# Patient Record
Sex: Male | Born: 1945
Health system: Southern US, Community
[De-identification: ages and names within clinical notes are randomized; demographics above are authoritative.]

## PROBLEM LIST (undated history)

## (undated) DIAGNOSIS — R51 Headache: Secondary | ICD-10-CM

## (undated) DIAGNOSIS — M1990 Unspecified osteoarthritis, unspecified site: Secondary | ICD-10-CM

## (undated) DIAGNOSIS — F329 Major depressive disorder, single episode, unspecified: Secondary | ICD-10-CM

## (undated) DIAGNOSIS — M5126 Other intervertebral disc displacement, lumbar region: Secondary | ICD-10-CM

## (undated) DIAGNOSIS — R202 Paresthesia of skin: Secondary | ICD-10-CM

## (undated) DIAGNOSIS — R011 Cardiac murmur, unspecified: Secondary | ICD-10-CM

## (undated) DIAGNOSIS — T753XXA Motion sickness, initial encounter: Secondary | ICD-10-CM

## (undated) DIAGNOSIS — F32A Depression, unspecified: Secondary | ICD-10-CM

## (undated) DIAGNOSIS — I10 Essential (primary) hypertension: Secondary | ICD-10-CM

## (undated) DIAGNOSIS — M8580 Other specified disorders of bone density and structure, unspecified site: Secondary | ICD-10-CM

## (undated) DIAGNOSIS — E785 Hyperlipidemia, unspecified: Secondary | ICD-10-CM

## (undated) DIAGNOSIS — M5136 Other intervertebral disc degeneration, lumbar region: Secondary | ICD-10-CM

## (undated) DIAGNOSIS — F419 Anxiety disorder, unspecified: Secondary | ICD-10-CM

## (undated) DIAGNOSIS — R519 Headache, unspecified: Secondary | ICD-10-CM

## (undated) DIAGNOSIS — E039 Hypothyroidism, unspecified: Secondary | ICD-10-CM

## (undated) DIAGNOSIS — F4010 Social phobia, unspecified: Secondary | ICD-10-CM

## (undated) DIAGNOSIS — E890 Postprocedural hypothyroidism: Secondary | ICD-10-CM

## (undated) DIAGNOSIS — M51369 Other intervertebral disc degeneration, lumbar region without mention of lumbar back pain or lower extremity pain: Secondary | ICD-10-CM

## (undated) HISTORY — DX: Social phobia, unspecified: F40.10

## (undated) HISTORY — DX: Unspecified osteoarthritis, unspecified site: M19.90

## (undated) HISTORY — DX: Other specified disorders of bone density and structure, unspecified site: M85.80

## (undated) HISTORY — DX: Depression, unspecified: F32.A

## (undated) HISTORY — DX: Anxiety disorder, unspecified: F41.9

## (undated) HISTORY — DX: Major depressive disorder, single episode, unspecified: F32.9

---

## 1994-07-20 HISTORY — PX: TOTAL THYROIDECTOMY: SHX2547

## 2006-07-20 HISTORY — PX: MENISCUS REPAIR: SHX5179

## 2007-01-04 ENCOUNTER — Ambulatory Visit: Payer: Self-pay | Admitting: Unknown Physician Specialty

## 2007-01-19 ENCOUNTER — Ambulatory Visit: Payer: Self-pay | Admitting: Orthopaedic Surgery

## 2007-01-25 ENCOUNTER — Ambulatory Visit: Payer: Self-pay | Admitting: Orthopaedic Surgery

## 2011-08-18 ENCOUNTER — Ambulatory Visit: Payer: Self-pay | Admitting: Family Medicine

## 2014-01-16 DIAGNOSIS — G44221 Chronic tension-type headache, intractable: Secondary | ICD-10-CM | POA: Insufficient documentation

## 2014-01-16 DIAGNOSIS — F410 Panic disorder [episodic paroxysmal anxiety] without agoraphobia: Secondary | ICD-10-CM | POA: Insufficient documentation

## 2014-01-17 DIAGNOSIS — F419 Anxiety disorder, unspecified: Secondary | ICD-10-CM | POA: Insufficient documentation

## 2014-01-17 DIAGNOSIS — E785 Hyperlipidemia, unspecified: Secondary | ICD-10-CM | POA: Insufficient documentation

## 2014-01-17 DIAGNOSIS — E890 Postprocedural hypothyroidism: Secondary | ICD-10-CM | POA: Insufficient documentation

## 2014-01-17 DIAGNOSIS — I10 Essential (primary) hypertension: Secondary | ICD-10-CM | POA: Insufficient documentation

## 2014-01-18 DIAGNOSIS — D72819 Decreased white blood cell count, unspecified: Secondary | ICD-10-CM | POA: Insufficient documentation

## 2014-07-25 DIAGNOSIS — M5032 Other cervical disc degeneration, mid-cervical region: Secondary | ICD-10-CM | POA: Diagnosis not present

## 2014-07-25 DIAGNOSIS — E890 Postprocedural hypothyroidism: Secondary | ICD-10-CM | POA: Diagnosis not present

## 2014-07-25 DIAGNOSIS — E780 Pure hypercholesterolemia: Secondary | ICD-10-CM | POA: Diagnosis not present

## 2014-07-25 DIAGNOSIS — M47892 Other spondylosis, cervical region: Secondary | ICD-10-CM | POA: Diagnosis not present

## 2014-07-25 DIAGNOSIS — M542 Cervicalgia: Secondary | ICD-10-CM | POA: Diagnosis not present

## 2014-07-25 DIAGNOSIS — G8929 Other chronic pain: Secondary | ICD-10-CM | POA: Diagnosis not present

## 2014-07-25 DIAGNOSIS — H16132 Photokeratitis, left eye: Secondary | ICD-10-CM | POA: Diagnosis not present

## 2014-08-14 DIAGNOSIS — F339 Major depressive disorder, recurrent, unspecified: Secondary | ICD-10-CM | POA: Diagnosis not present

## 2014-08-14 DIAGNOSIS — F410 Panic disorder [episodic paroxysmal anxiety] without agoraphobia: Secondary | ICD-10-CM | POA: Diagnosis not present

## 2014-08-16 DIAGNOSIS — M5030 Other cervical disc degeneration, unspecified cervical region: Secondary | ICD-10-CM | POA: Insufficient documentation

## 2014-08-16 DIAGNOSIS — M5412 Radiculopathy, cervical region: Secondary | ICD-10-CM | POA: Insufficient documentation

## 2014-08-20 DIAGNOSIS — D485 Neoplasm of uncertain behavior of skin: Secondary | ICD-10-CM | POA: Diagnosis not present

## 2014-08-20 DIAGNOSIS — B079 Viral wart, unspecified: Secondary | ICD-10-CM | POA: Diagnosis not present

## 2014-08-20 DIAGNOSIS — L578 Other skin changes due to chronic exposure to nonionizing radiation: Secondary | ICD-10-CM | POA: Diagnosis not present

## 2014-08-22 DIAGNOSIS — F419 Anxiety disorder, unspecified: Secondary | ICD-10-CM | POA: Diagnosis not present

## 2014-08-28 ENCOUNTER — Ambulatory Visit: Payer: Self-pay | Admitting: Physical Medicine and Rehabilitation

## 2014-08-28 DIAGNOSIS — M47812 Spondylosis without myelopathy or radiculopathy, cervical region: Secondary | ICD-10-CM | POA: Diagnosis not present

## 2014-08-28 DIAGNOSIS — M5022 Other cervical disc displacement, mid-cervical region: Secondary | ICD-10-CM | POA: Diagnosis not present

## 2014-08-28 DIAGNOSIS — M5032 Other cervical disc degeneration, mid-cervical region: Secondary | ICD-10-CM | POA: Diagnosis not present

## 2014-08-28 DIAGNOSIS — M5382 Other specified dorsopathies, cervical region: Secondary | ICD-10-CM | POA: Diagnosis not present

## 2014-08-28 DIAGNOSIS — M5082 Other cervical disc disorders, mid-cervical region: Secondary | ICD-10-CM | POA: Diagnosis not present

## 2014-08-30 DIAGNOSIS — F410 Panic disorder [episodic paroxysmal anxiety] without agoraphobia: Secondary | ICD-10-CM | POA: Diagnosis not present

## 2014-08-30 DIAGNOSIS — F4010 Social phobia, unspecified: Secondary | ICD-10-CM | POA: Diagnosis not present

## 2014-08-30 DIAGNOSIS — F411 Generalized anxiety disorder: Secondary | ICD-10-CM | POA: Diagnosis not present

## 2014-09-06 DIAGNOSIS — M5030 Other cervical disc degeneration, unspecified cervical region: Secondary | ICD-10-CM | POA: Diagnosis not present

## 2014-09-06 DIAGNOSIS — M5412 Radiculopathy, cervical region: Secondary | ICD-10-CM | POA: Diagnosis not present

## 2014-09-25 DIAGNOSIS — F329 Major depressive disorder, single episode, unspecified: Secondary | ICD-10-CM | POA: Diagnosis not present

## 2014-09-25 DIAGNOSIS — F411 Generalized anxiety disorder: Secondary | ICD-10-CM | POA: Diagnosis not present

## 2014-09-25 DIAGNOSIS — F410 Panic disorder [episodic paroxysmal anxiety] without agoraphobia: Secondary | ICD-10-CM | POA: Diagnosis not present

## 2014-10-09 DIAGNOSIS — M5030 Other cervical disc degeneration, unspecified cervical region: Secondary | ICD-10-CM | POA: Diagnosis not present

## 2014-10-09 DIAGNOSIS — M62838 Other muscle spasm: Secondary | ICD-10-CM | POA: Diagnosis not present

## 2014-10-09 DIAGNOSIS — M5412 Radiculopathy, cervical region: Secondary | ICD-10-CM | POA: Diagnosis not present

## 2014-10-26 DIAGNOSIS — M5030 Other cervical disc degeneration, unspecified cervical region: Secondary | ICD-10-CM | POA: Diagnosis not present

## 2014-10-26 DIAGNOSIS — M5412 Radiculopathy, cervical region: Secondary | ICD-10-CM | POA: Diagnosis not present

## 2014-11-07 DIAGNOSIS — F410 Panic disorder [episodic paroxysmal anxiety] without agoraphobia: Secondary | ICD-10-CM | POA: Diagnosis not present

## 2014-11-07 DIAGNOSIS — F329 Major depressive disorder, single episode, unspecified: Secondary | ICD-10-CM | POA: Diagnosis not present

## 2014-11-07 DIAGNOSIS — F411 Generalized anxiety disorder: Secondary | ICD-10-CM | POA: Diagnosis not present

## 2014-11-13 ENCOUNTER — Other Ambulatory Visit: Payer: Self-pay | Admitting: Physical Medicine and Rehabilitation

## 2014-11-13 ENCOUNTER — Other Ambulatory Visit: Payer: Self-pay | Admitting: Gastroenterology

## 2014-11-13 DIAGNOSIS — M5417 Radiculopathy, lumbosacral region: Secondary | ICD-10-CM

## 2014-11-13 DIAGNOSIS — M5412 Radiculopathy, cervical region: Secondary | ICD-10-CM | POA: Diagnosis not present

## 2014-11-13 DIAGNOSIS — M5030 Other cervical disc degeneration, unspecified cervical region: Secondary | ICD-10-CM | POA: Diagnosis not present

## 2014-11-13 DIAGNOSIS — M5116 Intervertebral disc disorders with radiculopathy, lumbar region: Secondary | ICD-10-CM | POA: Insufficient documentation

## 2014-11-13 DIAGNOSIS — M5416 Radiculopathy, lumbar region: Secondary | ICD-10-CM | POA: Diagnosis not present

## 2014-11-21 ENCOUNTER — Ambulatory Visit
Admission: RE | Admit: 2014-11-21 | Discharge: 2014-11-21 | Disposition: A | Discharge status: Home / Self Care | Payer: Medicare Other | Source: Ambulatory Visit | Attending: Physical Medicine and Rehabilitation | Admitting: Physical Medicine and Rehabilitation

## 2014-11-21 DIAGNOSIS — M5126 Other intervertebral disc displacement, lumbar region: Secondary | ICD-10-CM | POA: Diagnosis not present

## 2014-11-21 DIAGNOSIS — M5417 Radiculopathy, lumbosacral region: Secondary | ICD-10-CM

## 2014-11-21 DIAGNOSIS — M545 Low back pain: Secondary | ICD-10-CM | POA: Diagnosis present

## 2014-11-21 DIAGNOSIS — M47816 Spondylosis without myelopathy or radiculopathy, lumbar region: Secondary | ICD-10-CM | POA: Diagnosis not present

## 2014-11-21 DIAGNOSIS — M79604 Pain in right leg: Secondary | ICD-10-CM | POA: Diagnosis present

## 2014-11-21 DIAGNOSIS — M5127 Other intervertebral disc displacement, lumbosacral region: Secondary | ICD-10-CM | POA: Diagnosis not present

## 2014-11-21 DIAGNOSIS — M47817 Spondylosis without myelopathy or radiculopathy, lumbosacral region: Secondary | ICD-10-CM | POA: Diagnosis not present

## 2014-12-12 DIAGNOSIS — Z79899 Other long term (current) drug therapy: Secondary | ICD-10-CM | POA: Diagnosis not present

## 2014-12-12 DIAGNOSIS — F328 Other depressive episodes: Secondary | ICD-10-CM | POA: Diagnosis not present

## 2014-12-12 DIAGNOSIS — F411 Generalized anxiety disorder: Secondary | ICD-10-CM | POA: Diagnosis not present

## 2014-12-12 DIAGNOSIS — F410 Panic disorder [episodic paroxysmal anxiety] without agoraphobia: Secondary | ICD-10-CM | POA: Diagnosis not present

## 2014-12-12 DIAGNOSIS — F4010 Social phobia, unspecified: Secondary | ICD-10-CM | POA: Diagnosis not present

## 2014-12-12 DIAGNOSIS — F329 Major depressive disorder, single episode, unspecified: Secondary | ICD-10-CM | POA: Diagnosis not present

## 2014-12-13 DIAGNOSIS — M5412 Radiculopathy, cervical region: Secondary | ICD-10-CM | POA: Diagnosis not present

## 2014-12-13 DIAGNOSIS — M5126 Other intervertebral disc displacement, lumbar region: Secondary | ICD-10-CM | POA: Diagnosis not present

## 2014-12-13 DIAGNOSIS — M5030 Other cervical disc degeneration, unspecified cervical region: Secondary | ICD-10-CM | POA: Diagnosis not present

## 2014-12-13 DIAGNOSIS — M5416 Radiculopathy, lumbar region: Secondary | ICD-10-CM | POA: Diagnosis not present

## 2014-12-28 DIAGNOSIS — M9902 Segmental and somatic dysfunction of thoracic region: Secondary | ICD-10-CM | POA: Diagnosis not present

## 2014-12-28 DIAGNOSIS — M9903 Segmental and somatic dysfunction of lumbar region: Secondary | ICD-10-CM | POA: Diagnosis not present

## 2014-12-28 DIAGNOSIS — M5441 Lumbago with sciatica, right side: Secondary | ICD-10-CM | POA: Diagnosis not present

## 2014-12-28 DIAGNOSIS — M546 Pain in thoracic spine: Secondary | ICD-10-CM | POA: Diagnosis not present

## 2015-01-01 DIAGNOSIS — M9901 Segmental and somatic dysfunction of cervical region: Secondary | ICD-10-CM | POA: Diagnosis not present

## 2015-01-01 DIAGNOSIS — G44209 Tension-type headache, unspecified, not intractable: Secondary | ICD-10-CM | POA: Diagnosis not present

## 2015-01-01 DIAGNOSIS — M9903 Segmental and somatic dysfunction of lumbar region: Secondary | ICD-10-CM | POA: Diagnosis not present

## 2015-01-01 DIAGNOSIS — M5441 Lumbago with sciatica, right side: Secondary | ICD-10-CM | POA: Diagnosis not present

## 2015-01-01 DIAGNOSIS — M9902 Segmental and somatic dysfunction of thoracic region: Secondary | ICD-10-CM | POA: Diagnosis not present

## 2015-01-03 DIAGNOSIS — M545 Low back pain: Secondary | ICD-10-CM | POA: Diagnosis not present

## 2015-01-03 DIAGNOSIS — M9904 Segmental and somatic dysfunction of sacral region: Secondary | ICD-10-CM | POA: Diagnosis not present

## 2015-01-03 DIAGNOSIS — M9907 Segmental and somatic dysfunction of upper extremity: Secondary | ICD-10-CM | POA: Diagnosis not present

## 2015-01-03 DIAGNOSIS — M9903 Segmental and somatic dysfunction of lumbar region: Secondary | ICD-10-CM | POA: Diagnosis not present

## 2015-01-08 DIAGNOSIS — M9907 Segmental and somatic dysfunction of upper extremity: Secondary | ICD-10-CM | POA: Diagnosis not present

## 2015-01-08 DIAGNOSIS — M9903 Segmental and somatic dysfunction of lumbar region: Secondary | ICD-10-CM | POA: Diagnosis not present

## 2015-01-08 DIAGNOSIS — M9904 Segmental and somatic dysfunction of sacral region: Secondary | ICD-10-CM | POA: Diagnosis not present

## 2015-01-08 DIAGNOSIS — M545 Low back pain: Secondary | ICD-10-CM | POA: Diagnosis not present

## 2015-01-08 DIAGNOSIS — M9902 Segmental and somatic dysfunction of thoracic region: Secondary | ICD-10-CM | POA: Diagnosis not present

## 2015-01-10 DIAGNOSIS — M9902 Segmental and somatic dysfunction of thoracic region: Secondary | ICD-10-CM | POA: Diagnosis not present

## 2015-01-10 DIAGNOSIS — M545 Low back pain: Secondary | ICD-10-CM | POA: Diagnosis not present

## 2015-01-10 DIAGNOSIS — M9907 Segmental and somatic dysfunction of upper extremity: Secondary | ICD-10-CM | POA: Diagnosis not present

## 2015-01-10 DIAGNOSIS — M9904 Segmental and somatic dysfunction of sacral region: Secondary | ICD-10-CM | POA: Diagnosis not present

## 2015-01-10 DIAGNOSIS — M9903 Segmental and somatic dysfunction of lumbar region: Secondary | ICD-10-CM | POA: Diagnosis not present

## 2015-01-15 DIAGNOSIS — M9903 Segmental and somatic dysfunction of lumbar region: Secondary | ICD-10-CM | POA: Diagnosis not present

## 2015-01-15 DIAGNOSIS — M9902 Segmental and somatic dysfunction of thoracic region: Secondary | ICD-10-CM | POA: Diagnosis not present

## 2015-01-15 DIAGNOSIS — M9904 Segmental and somatic dysfunction of sacral region: Secondary | ICD-10-CM | POA: Diagnosis not present

## 2015-01-15 DIAGNOSIS — M545 Low back pain: Secondary | ICD-10-CM | POA: Diagnosis not present

## 2015-01-17 DIAGNOSIS — M9904 Segmental and somatic dysfunction of sacral region: Secondary | ICD-10-CM | POA: Diagnosis not present

## 2015-01-17 DIAGNOSIS — M9902 Segmental and somatic dysfunction of thoracic region: Secondary | ICD-10-CM | POA: Diagnosis not present

## 2015-01-17 DIAGNOSIS — M9903 Segmental and somatic dysfunction of lumbar region: Secondary | ICD-10-CM | POA: Diagnosis not present

## 2015-01-17 DIAGNOSIS — M545 Low back pain: Secondary | ICD-10-CM | POA: Diagnosis not present

## 2015-01-23 DIAGNOSIS — Z125 Encounter for screening for malignant neoplasm of prostate: Secondary | ICD-10-CM | POA: Diagnosis not present

## 2015-01-23 DIAGNOSIS — E890 Postprocedural hypothyroidism: Secondary | ICD-10-CM | POA: Diagnosis not present

## 2015-01-23 DIAGNOSIS — Z0000 Encounter for general adult medical examination without abnormal findings: Secondary | ICD-10-CM | POA: Diagnosis not present

## 2015-01-25 DIAGNOSIS — M545 Low back pain: Secondary | ICD-10-CM | POA: Diagnosis not present

## 2015-01-25 DIAGNOSIS — M9902 Segmental and somatic dysfunction of thoracic region: Secondary | ICD-10-CM | POA: Diagnosis not present

## 2015-01-25 DIAGNOSIS — M9904 Segmental and somatic dysfunction of sacral region: Secondary | ICD-10-CM | POA: Diagnosis not present

## 2015-01-25 DIAGNOSIS — M9903 Segmental and somatic dysfunction of lumbar region: Secondary | ICD-10-CM | POA: Diagnosis not present

## 2015-01-30 DIAGNOSIS — F410 Panic disorder [episodic paroxysmal anxiety] without agoraphobia: Secondary | ICD-10-CM | POA: Diagnosis not present

## 2015-01-30 DIAGNOSIS — F411 Generalized anxiety disorder: Secondary | ICD-10-CM | POA: Diagnosis not present

## 2015-01-30 DIAGNOSIS — F329 Major depressive disorder, single episode, unspecified: Secondary | ICD-10-CM | POA: Diagnosis not present

## 2015-02-01 DIAGNOSIS — M9904 Segmental and somatic dysfunction of sacral region: Secondary | ICD-10-CM | POA: Diagnosis not present

## 2015-02-01 DIAGNOSIS — M9903 Segmental and somatic dysfunction of lumbar region: Secondary | ICD-10-CM | POA: Diagnosis not present

## 2015-02-01 DIAGNOSIS — M545 Low back pain: Secondary | ICD-10-CM | POA: Diagnosis not present

## 2015-02-01 DIAGNOSIS — M9902 Segmental and somatic dysfunction of thoracic region: Secondary | ICD-10-CM | POA: Diagnosis not present

## 2015-02-01 DIAGNOSIS — M9901 Segmental and somatic dysfunction of cervical region: Secondary | ICD-10-CM | POA: Diagnosis not present

## 2015-02-01 DIAGNOSIS — G44209 Tension-type headache, unspecified, not intractable: Secondary | ICD-10-CM | POA: Diagnosis not present

## 2015-02-08 DIAGNOSIS — M9902 Segmental and somatic dysfunction of thoracic region: Secondary | ICD-10-CM | POA: Diagnosis not present

## 2015-02-08 DIAGNOSIS — M545 Low back pain: Secondary | ICD-10-CM | POA: Diagnosis not present

## 2015-02-08 DIAGNOSIS — M9904 Segmental and somatic dysfunction of sacral region: Secondary | ICD-10-CM | POA: Diagnosis not present

## 2015-02-08 DIAGNOSIS — M9903 Segmental and somatic dysfunction of lumbar region: Secondary | ICD-10-CM | POA: Diagnosis not present

## 2015-02-15 DIAGNOSIS — M9903 Segmental and somatic dysfunction of lumbar region: Secondary | ICD-10-CM | POA: Diagnosis not present

## 2015-02-15 DIAGNOSIS — M545 Low back pain: Secondary | ICD-10-CM | POA: Diagnosis not present

## 2015-02-15 DIAGNOSIS — M9902 Segmental and somatic dysfunction of thoracic region: Secondary | ICD-10-CM | POA: Diagnosis not present

## 2015-02-15 DIAGNOSIS — M9904 Segmental and somatic dysfunction of sacral region: Secondary | ICD-10-CM | POA: Diagnosis not present

## 2015-02-22 DIAGNOSIS — M9904 Segmental and somatic dysfunction of sacral region: Secondary | ICD-10-CM | POA: Diagnosis not present

## 2015-02-22 DIAGNOSIS — M9903 Segmental and somatic dysfunction of lumbar region: Secondary | ICD-10-CM | POA: Diagnosis not present

## 2015-02-22 DIAGNOSIS — M545 Low back pain: Secondary | ICD-10-CM | POA: Diagnosis not present

## 2015-02-22 DIAGNOSIS — M9902 Segmental and somatic dysfunction of thoracic region: Secondary | ICD-10-CM | POA: Diagnosis not present

## 2015-03-01 DIAGNOSIS — M9902 Segmental and somatic dysfunction of thoracic region: Secondary | ICD-10-CM | POA: Diagnosis not present

## 2015-03-01 DIAGNOSIS — M9903 Segmental and somatic dysfunction of lumbar region: Secondary | ICD-10-CM | POA: Diagnosis not present

## 2015-03-01 DIAGNOSIS — M9904 Segmental and somatic dysfunction of sacral region: Secondary | ICD-10-CM | POA: Diagnosis not present

## 2015-03-01 DIAGNOSIS — M545 Low back pain: Secondary | ICD-10-CM | POA: Diagnosis not present

## 2015-03-04 DIAGNOSIS — Z6827 Body mass index (BMI) 27.0-27.9, adult: Secondary | ICD-10-CM | POA: Diagnosis not present

## 2015-03-04 DIAGNOSIS — N401 Enlarged prostate with lower urinary tract symptoms: Secondary | ICD-10-CM

## 2015-03-04 DIAGNOSIS — N138 Other obstructive and reflux uropathy: Secondary | ICD-10-CM | POA: Insufficient documentation

## 2015-03-04 DIAGNOSIS — R972 Elevated prostate specific antigen [PSA]: Secondary | ICD-10-CM | POA: Diagnosis not present

## 2015-03-15 DIAGNOSIS — M9904 Segmental and somatic dysfunction of sacral region: Secondary | ICD-10-CM | POA: Diagnosis not present

## 2015-03-15 DIAGNOSIS — M9902 Segmental and somatic dysfunction of thoracic region: Secondary | ICD-10-CM | POA: Diagnosis not present

## 2015-03-15 DIAGNOSIS — M9903 Segmental and somatic dysfunction of lumbar region: Secondary | ICD-10-CM | POA: Diagnosis not present

## 2015-03-15 DIAGNOSIS — M545 Low back pain: Secondary | ICD-10-CM | POA: Diagnosis not present

## 2015-03-26 DIAGNOSIS — M9903 Segmental and somatic dysfunction of lumbar region: Secondary | ICD-10-CM | POA: Diagnosis not present

## 2015-03-26 DIAGNOSIS — M9902 Segmental and somatic dysfunction of thoracic region: Secondary | ICD-10-CM | POA: Diagnosis not present

## 2015-03-26 DIAGNOSIS — M545 Low back pain: Secondary | ICD-10-CM | POA: Diagnosis not present

## 2015-03-26 DIAGNOSIS — M9904 Segmental and somatic dysfunction of sacral region: Secondary | ICD-10-CM | POA: Diagnosis not present

## 2015-04-01 DIAGNOSIS — F410 Panic disorder [episodic paroxysmal anxiety] without agoraphobia: Secondary | ICD-10-CM | POA: Diagnosis not present

## 2015-04-01 DIAGNOSIS — F411 Generalized anxiety disorder: Secondary | ICD-10-CM | POA: Diagnosis not present

## 2015-04-01 DIAGNOSIS — F329 Major depressive disorder, single episode, unspecified: Secondary | ICD-10-CM | POA: Diagnosis not present

## 2015-04-08 DIAGNOSIS — N401 Enlarged prostate with lower urinary tract symptoms: Secondary | ICD-10-CM | POA: Diagnosis not present

## 2015-04-08 DIAGNOSIS — Z6827 Body mass index (BMI) 27.0-27.9, adult: Secondary | ICD-10-CM | POA: Diagnosis not present

## 2015-04-08 DIAGNOSIS — R972 Elevated prostate specific antigen [PSA]: Secondary | ICD-10-CM | POA: Diagnosis not present

## 2015-04-09 DIAGNOSIS — M545 Low back pain: Secondary | ICD-10-CM | POA: Diagnosis not present

## 2015-04-09 DIAGNOSIS — M9902 Segmental and somatic dysfunction of thoracic region: Secondary | ICD-10-CM | POA: Diagnosis not present

## 2015-04-09 DIAGNOSIS — M9903 Segmental and somatic dysfunction of lumbar region: Secondary | ICD-10-CM | POA: Diagnosis not present

## 2015-04-09 DIAGNOSIS — M9904 Segmental and somatic dysfunction of sacral region: Secondary | ICD-10-CM | POA: Diagnosis not present

## 2015-04-19 DIAGNOSIS — M9903 Segmental and somatic dysfunction of lumbar region: Secondary | ICD-10-CM | POA: Diagnosis not present

## 2015-04-19 DIAGNOSIS — M9902 Segmental and somatic dysfunction of thoracic region: Secondary | ICD-10-CM | POA: Diagnosis not present

## 2015-04-19 DIAGNOSIS — M9904 Segmental and somatic dysfunction of sacral region: Secondary | ICD-10-CM | POA: Diagnosis not present

## 2015-04-19 DIAGNOSIS — M545 Low back pain: Secondary | ICD-10-CM | POA: Diagnosis not present

## 2015-05-03 DIAGNOSIS — M9904 Segmental and somatic dysfunction of sacral region: Secondary | ICD-10-CM | POA: Diagnosis not present

## 2015-05-03 DIAGNOSIS — M9903 Segmental and somatic dysfunction of lumbar region: Secondary | ICD-10-CM | POA: Diagnosis not present

## 2015-05-03 DIAGNOSIS — M9902 Segmental and somatic dysfunction of thoracic region: Secondary | ICD-10-CM | POA: Diagnosis not present

## 2015-05-03 DIAGNOSIS — M9901 Segmental and somatic dysfunction of cervical region: Secondary | ICD-10-CM | POA: Diagnosis not present

## 2015-05-03 DIAGNOSIS — G44209 Tension-type headache, unspecified, not intractable: Secondary | ICD-10-CM | POA: Diagnosis not present

## 2015-05-07 DIAGNOSIS — J0100 Acute maxillary sinusitis, unspecified: Secondary | ICD-10-CM | POA: Diagnosis not present

## 2015-05-17 DIAGNOSIS — M9903 Segmental and somatic dysfunction of lumbar region: Secondary | ICD-10-CM | POA: Diagnosis not present

## 2015-05-17 DIAGNOSIS — M9904 Segmental and somatic dysfunction of sacral region: Secondary | ICD-10-CM | POA: Diagnosis not present

## 2015-05-17 DIAGNOSIS — M9902 Segmental and somatic dysfunction of thoracic region: Secondary | ICD-10-CM | POA: Diagnosis not present

## 2015-05-17 DIAGNOSIS — M9901 Segmental and somatic dysfunction of cervical region: Secondary | ICD-10-CM | POA: Diagnosis not present

## 2015-05-28 DIAGNOSIS — F411 Generalized anxiety disorder: Secondary | ICD-10-CM | POA: Diagnosis not present

## 2015-05-28 DIAGNOSIS — F329 Major depressive disorder, single episode, unspecified: Secondary | ICD-10-CM | POA: Diagnosis not present

## 2015-05-28 DIAGNOSIS — M545 Low back pain: Secondary | ICD-10-CM | POA: Diagnosis not present

## 2015-05-28 DIAGNOSIS — F410 Panic disorder [episodic paroxysmal anxiety] without agoraphobia: Secondary | ICD-10-CM | POA: Diagnosis not present

## 2015-05-28 DIAGNOSIS — M9901 Segmental and somatic dysfunction of cervical region: Secondary | ICD-10-CM | POA: Diagnosis not present

## 2015-05-28 DIAGNOSIS — M9903 Segmental and somatic dysfunction of lumbar region: Secondary | ICD-10-CM | POA: Diagnosis not present

## 2015-05-28 DIAGNOSIS — G44209 Tension-type headache, unspecified, not intractable: Secondary | ICD-10-CM | POA: Diagnosis not present

## 2015-06-07 DIAGNOSIS — M9903 Segmental and somatic dysfunction of lumbar region: Secondary | ICD-10-CM | POA: Diagnosis not present

## 2015-06-07 DIAGNOSIS — G44209 Tension-type headache, unspecified, not intractable: Secondary | ICD-10-CM | POA: Diagnosis not present

## 2015-06-07 DIAGNOSIS — M9902 Segmental and somatic dysfunction of thoracic region: Secondary | ICD-10-CM | POA: Diagnosis not present

## 2015-06-07 DIAGNOSIS — M545 Low back pain: Secondary | ICD-10-CM | POA: Diagnosis not present

## 2015-06-07 DIAGNOSIS — M9901 Segmental and somatic dysfunction of cervical region: Secondary | ICD-10-CM | POA: Diagnosis not present

## 2015-06-07 DIAGNOSIS — M9904 Segmental and somatic dysfunction of sacral region: Secondary | ICD-10-CM | POA: Diagnosis not present

## 2015-06-21 DIAGNOSIS — M9901 Segmental and somatic dysfunction of cervical region: Secondary | ICD-10-CM | POA: Diagnosis not present

## 2015-06-21 DIAGNOSIS — M9904 Segmental and somatic dysfunction of sacral region: Secondary | ICD-10-CM | POA: Diagnosis not present

## 2015-06-21 DIAGNOSIS — G44209 Tension-type headache, unspecified, not intractable: Secondary | ICD-10-CM | POA: Diagnosis not present

## 2015-06-21 DIAGNOSIS — M545 Low back pain: Secondary | ICD-10-CM | POA: Diagnosis not present

## 2015-06-28 DIAGNOSIS — M9903 Segmental and somatic dysfunction of lumbar region: Secondary | ICD-10-CM | POA: Diagnosis not present

## 2015-06-28 DIAGNOSIS — M9902 Segmental and somatic dysfunction of thoracic region: Secondary | ICD-10-CM | POA: Diagnosis not present

## 2015-06-28 DIAGNOSIS — M9904 Segmental and somatic dysfunction of sacral region: Secondary | ICD-10-CM | POA: Diagnosis not present

## 2015-06-28 DIAGNOSIS — G44209 Tension-type headache, unspecified, not intractable: Secondary | ICD-10-CM | POA: Diagnosis not present

## 2015-06-28 DIAGNOSIS — M9901 Segmental and somatic dysfunction of cervical region: Secondary | ICD-10-CM | POA: Diagnosis not present

## 2015-07-05 DIAGNOSIS — M9904 Segmental and somatic dysfunction of sacral region: Secondary | ICD-10-CM | POA: Diagnosis not present

## 2015-07-05 DIAGNOSIS — M9902 Segmental and somatic dysfunction of thoracic region: Secondary | ICD-10-CM | POA: Diagnosis not present

## 2015-07-05 DIAGNOSIS — M9903 Segmental and somatic dysfunction of lumbar region: Secondary | ICD-10-CM | POA: Diagnosis not present

## 2015-07-09 DIAGNOSIS — F411 Generalized anxiety disorder: Secondary | ICD-10-CM | POA: Diagnosis not present

## 2015-07-09 DIAGNOSIS — F410 Panic disorder [episodic paroxysmal anxiety] without agoraphobia: Secondary | ICD-10-CM | POA: Diagnosis not present

## 2015-07-23 DIAGNOSIS — M9903 Segmental and somatic dysfunction of lumbar region: Secondary | ICD-10-CM | POA: Diagnosis not present

## 2015-07-23 DIAGNOSIS — M9902 Segmental and somatic dysfunction of thoracic region: Secondary | ICD-10-CM | POA: Diagnosis not present

## 2015-07-23 DIAGNOSIS — M9904 Segmental and somatic dysfunction of sacral region: Secondary | ICD-10-CM | POA: Diagnosis not present

## 2015-08-02 DIAGNOSIS — I10 Essential (primary) hypertension: Secondary | ICD-10-CM | POA: Diagnosis not present

## 2015-08-02 DIAGNOSIS — Z125 Encounter for screening for malignant neoplasm of prostate: Secondary | ICD-10-CM | POA: Diagnosis not present

## 2015-08-02 DIAGNOSIS — E890 Postprocedural hypothyroidism: Secondary | ICD-10-CM | POA: Diagnosis not present

## 2015-08-02 DIAGNOSIS — E7800 Pure hypercholesterolemia, unspecified: Secondary | ICD-10-CM | POA: Diagnosis not present

## 2015-08-06 DIAGNOSIS — F329 Major depressive disorder, single episode, unspecified: Secondary | ICD-10-CM | POA: Diagnosis not present

## 2015-08-06 DIAGNOSIS — F410 Panic disorder [episodic paroxysmal anxiety] without agoraphobia: Secondary | ICD-10-CM | POA: Diagnosis not present

## 2015-08-06 DIAGNOSIS — F411 Generalized anxiety disorder: Secondary | ICD-10-CM | POA: Diagnosis not present

## 2015-08-09 DIAGNOSIS — M9902 Segmental and somatic dysfunction of thoracic region: Secondary | ICD-10-CM | POA: Diagnosis not present

## 2015-08-09 DIAGNOSIS — M9903 Segmental and somatic dysfunction of lumbar region: Secondary | ICD-10-CM | POA: Diagnosis not present

## 2015-08-09 DIAGNOSIS — M9901 Segmental and somatic dysfunction of cervical region: Secondary | ICD-10-CM | POA: Diagnosis not present

## 2015-08-09 DIAGNOSIS — M9904 Segmental and somatic dysfunction of sacral region: Secondary | ICD-10-CM | POA: Diagnosis not present

## 2015-08-23 DIAGNOSIS — G44209 Tension-type headache, unspecified, not intractable: Secondary | ICD-10-CM | POA: Diagnosis not present

## 2015-08-23 DIAGNOSIS — M9902 Segmental and somatic dysfunction of thoracic region: Secondary | ICD-10-CM | POA: Diagnosis not present

## 2015-08-23 DIAGNOSIS — M9903 Segmental and somatic dysfunction of lumbar region: Secondary | ICD-10-CM | POA: Diagnosis not present

## 2015-09-17 DIAGNOSIS — F329 Major depressive disorder, single episode, unspecified: Secondary | ICD-10-CM | POA: Diagnosis not present

## 2015-09-17 DIAGNOSIS — F411 Generalized anxiety disorder: Secondary | ICD-10-CM | POA: Diagnosis not present

## 2015-09-17 DIAGNOSIS — F410 Panic disorder [episodic paroxysmal anxiety] without agoraphobia: Secondary | ICD-10-CM | POA: Diagnosis not present

## 2015-10-10 ENCOUNTER — Encounter: Payer: Self-pay | Admitting: Family Medicine

## 2015-10-10 ENCOUNTER — Ambulatory Visit (INDEPENDENT_AMBULATORY_CARE_PROVIDER_SITE_OTHER): Payer: Medicare Other | Admitting: Family Medicine

## 2015-10-10 VITALS — BP 130/73 | HR 77 | Ht 68.0 in | Wt 188.0 lb

## 2015-10-10 DIAGNOSIS — E890 Postprocedural hypothyroidism: Secondary | ICD-10-CM

## 2015-10-10 DIAGNOSIS — F419 Anxiety disorder, unspecified: Secondary | ICD-10-CM | POA: Diagnosis not present

## 2015-10-10 DIAGNOSIS — M25561 Pain in right knee: Secondary | ICD-10-CM

## 2015-10-10 DIAGNOSIS — E785 Hyperlipidemia, unspecified: Secondary | ICD-10-CM | POA: Diagnosis not present

## 2015-10-10 DIAGNOSIS — M5030 Other cervical disc degeneration, unspecified cervical region: Secondary | ICD-10-CM

## 2015-10-10 DIAGNOSIS — N401 Enlarged prostate with lower urinary tract symptoms: Secondary | ICD-10-CM | POA: Diagnosis not present

## 2015-10-10 DIAGNOSIS — F1320 Sedative, hypnotic or anxiolytic dependence, uncomplicated: Secondary | ICD-10-CM | POA: Diagnosis not present

## 2015-10-10 DIAGNOSIS — N138 Other obstructive and reflux uropathy: Secondary | ICD-10-CM

## 2015-10-10 DIAGNOSIS — H9202 Otalgia, left ear: Secondary | ICD-10-CM | POA: Diagnosis not present

## 2015-10-10 NOTE — Progress Notes (Signed)
Date:  10/10/2015   Name:  Ralph Salinas   DOB:  1945/09/14   MRN:  [id number]  PCP:  No primary care provider on file.    Chief Complaint: New Evaluation and Anxiety   History of Present Illness:  This is a 70 y.o. male to establish care. Has bulging discs in neck and lower back, saw spine specialist and got shot but only lasted for two weeks so never went back, chiro helping neck but not back. C/o R knee pain, meniscal surgery in past but pain more frequent and severe now. C/o L ear constant popping, had appt to see ENT in [place] but couldn't navigate traffic, prefers only early morning appts here or in [place]. Hx panic d/o and depression on Cymbalta and Effexor as well as Buspar and daily Xanax bid-tid, followed by [facility]. Not told to stop Effexor when Cymbalta started, now taking Cymbalta bid. Hx elevated PSA, saw urologist who put on med for one month then recheck normal (2.7 in Jan), told BPH. S/p thyroidectomy, TSH nl in Jan. Hx HLD with TC > 300, LDL 115 on Zocor. B12 ok 2015, no vit D level in chart. OA neck causing intermittent HA's, takes BC powder prn. On vit C/E because told healthy.  Review of Systems:  Review of Systems  Constitutional: Negative for fever and fatigue.  Respiratory: Negative for cough and shortness of breath.   Cardiovascular: Negative for chest pain and leg swelling.  Endocrine: Negative for polyuria.  Genitourinary: Negative for difficulty urinating.  Neurological: Negative for syncope and light-headedness.    Patient Active Problem List   Diagnosis Date Noted  . Benign prostatic hyperplasia with urinary obstruction 03/04/2015  . Elevated prostate specific antigen (PSA) 03/04/2015  . Neuritis or radiculitis due to rupture of lumbar intervertebral disc 11/13/2014  . Degeneration of intervertebral disc of cervical region 08/16/2014  . Decreased leukocytes 01/18/2014  . Anxiety 01/17/2014  . HLD (hyperlipidemia) 01/17/2014   . Hypothyroidism, postop 01/17/2014  . Chronic tension-type headache, intractable 01/16/2014  . Panic attack 01/16/2014    Prior to Admission medications   Medication Sig Start Date End Date Taking? Authorizing Provider  ALPRAZolam Duanne Moron) 1 MG tablet Take 1 mg by mouth 3 (three) times daily as needed.   Yes Historical Provider, MD  Ascorbic Acid (VITAMIN C) 1000 MG tablet Take 1,000 mg by mouth daily.   Yes Historical Provider, MD  busPIRone (BUSPAR) 15 MG tablet Take 1 tablet by mouth 2 (two) times daily. 09/27/15  Yes Historical Provider, MD  DULoxetine (CYMBALTA) 60 MG capsule Take 1 capsule by mouth 2 (two) times daily. 09/27/15  Yes Historical Provider, MD  levothyroxine (SYNTHROID, LEVOTHROID) 137 MCG tablet Take 1 tablet by mouth daily. 01/29/15 01/29/16 Yes Historical Provider, MD  simvastatin (ZOCOR) 40 MG tablet Take 1 tablet by mouth daily. 09/11/15  Yes Historical Provider, MD    No Known Allergies  Past Surgical History  Procedure Laterality Date  . Total thyroidectomy  1996  . Meniscus repair Right 2008    Social History  Substance Use Topics  . Smoking status: Never Smoker   . Smokeless tobacco: None  . Alcohol Use: No    Family History  Problem Relation Age of Onset  . Heart attack Mother   . Leukemia Father     Medication list has been reviewed and updated.  Physical Examination: BP 130/73 mmHg  Pulse 77  Ht 5\' 8"  (1.727 m)  Wt 188 lb (85.276 kg)  BMI 28.59 kg/m2  Physical Exam  Constitutional: He is oriented to person, place, and time. He appears well-developed and well-nourished.  HENT:  Head: Atraumatic.  Right Ear: External ear normal.  Left Ear: External ear normal.  Nose: Nose normal.  Mouth/Throat: Oropharynx is clear and moist.  TM's clear  Eyes: Conjunctivae and EOM are normal. Pupils are equal, round, and reactive to light. No scleral icterus.  Neck: Neck supple. No thyromegaly present.  Cardiovascular: Normal rate, regular rhythm and  normal heart sounds.   Pulmonary/Chest: Effort normal and breath sounds normal.  Abdominal: Soft. He exhibits no distension and no mass. There is no tenderness.  Musculoskeletal: He exhibits no edema.  Lymphadenopathy:    He has no cervical adenopathy.  Neurological: He is alert and oriented to person, place, and time. Coordination normal.  Skin: Skin is warm and dry.  Psychiatric: His behavior is normal.  Anxious mood  Nursing note and vitals reviewed.   Assessment and Plan:  1. Knee pain, right Persistent s/p meniscal surgery - Ambulatory referral to Orthopedic Surgery  2. Left ear pain Recurrent - Ambulatory referral to ENT  3. Hypothyroidism, postop Well controlled in Jan, cont Synthroid  4. Degeneration of intervertebral disc of cervical region Cont chiro care, has seen spine speicalist  5. Benign prostatic hyperplasia with urinary obstruction PSA normalized on abxs, sxs stable, monitor  6. Anxiety Followed by psych, d/c Effexor as likely meant to be stopped when Cymbalta started  7. HLD (hyperlipidemia) Well controlled on Zocor  8. Benzodiazepine dependence ([place]) Followed by psych on long term Xanax  9. Med review D/c vit E as unclear indication for use, consider d/c vit C/Buspar/Zocor in future  Return in about 3 months (around 01/10/2016). Consider vit D level then.  Satira Anis. [facility]  10/10/2015

## 2015-10-17 DIAGNOSIS — M222X1 Patellofemoral disorders, right knee: Secondary | ICD-10-CM | POA: Diagnosis not present

## 2015-10-17 DIAGNOSIS — M25561 Pain in right knee: Secondary | ICD-10-CM | POA: Diagnosis not present

## 2015-10-29 DIAGNOSIS — H90A22 Sensorineural hearing loss, unilateral, left ear, with restricted hearing on the contralateral side: Secondary | ICD-10-CM | POA: Diagnosis not present

## 2015-10-29 DIAGNOSIS — J301 Allergic rhinitis due to pollen: Secondary | ICD-10-CM | POA: Diagnosis not present

## 2015-10-29 DIAGNOSIS — H9209 Otalgia, unspecified ear: Secondary | ICD-10-CM | POA: Diagnosis not present

## 2015-10-29 DIAGNOSIS — H9319 Tinnitus, unspecified ear: Secondary | ICD-10-CM | POA: Diagnosis not present

## 2015-10-29 DIAGNOSIS — H903 Sensorineural hearing loss, bilateral: Secondary | ICD-10-CM | POA: Diagnosis not present

## 2015-11-11 DIAGNOSIS — F411 Generalized anxiety disorder: Secondary | ICD-10-CM | POA: Diagnosis not present

## 2015-11-11 DIAGNOSIS — F410 Panic disorder [episodic paroxysmal anxiety] without agoraphobia: Secondary | ICD-10-CM | POA: Diagnosis not present

## 2015-11-11 DIAGNOSIS — F329 Major depressive disorder, single episode, unspecified: Secondary | ICD-10-CM | POA: Diagnosis not present

## 2015-11-21 DIAGNOSIS — M25561 Pain in right knee: Secondary | ICD-10-CM | POA: Diagnosis not present

## 2015-11-21 DIAGNOSIS — M25569 Pain in unspecified knee: Secondary | ICD-10-CM | POA: Insufficient documentation

## 2016-01-07 DIAGNOSIS — F4010 Social phobia, unspecified: Secondary | ICD-10-CM | POA: Diagnosis not present

## 2016-01-07 DIAGNOSIS — F329 Major depressive disorder, single episode, unspecified: Secondary | ICD-10-CM | POA: Diagnosis not present

## 2016-01-07 DIAGNOSIS — F411 Generalized anxiety disorder: Secondary | ICD-10-CM | POA: Diagnosis not present

## 2016-01-07 DIAGNOSIS — F410 Panic disorder [episodic paroxysmal anxiety] without agoraphobia: Secondary | ICD-10-CM | POA: Diagnosis not present

## 2016-01-10 ENCOUNTER — Ambulatory Visit (INDEPENDENT_AMBULATORY_CARE_PROVIDER_SITE_OTHER): Payer: Medicare Other | Admitting: Family Medicine

## 2016-01-10 ENCOUNTER — Encounter: Payer: Self-pay | Admitting: Family Medicine

## 2016-01-10 VITALS — BP 122/82 | HR 67 | Temp 98.0°F | Resp 16 | Ht 68.0 in | Wt 189.8 lb

## 2016-01-10 DIAGNOSIS — M5030 Other cervical disc degeneration, unspecified cervical region: Secondary | ICD-10-CM | POA: Diagnosis not present

## 2016-01-10 DIAGNOSIS — F419 Anxiety disorder, unspecified: Secondary | ICD-10-CM

## 2016-01-10 DIAGNOSIS — E785 Hyperlipidemia, unspecified: Secondary | ICD-10-CM

## 2016-01-10 DIAGNOSIS — E559 Vitamin D deficiency, unspecified: Secondary | ICD-10-CM | POA: Diagnosis not present

## 2016-01-10 DIAGNOSIS — E890 Postprocedural hypothyroidism: Secondary | ICD-10-CM | POA: Diagnosis not present

## 2016-01-10 DIAGNOSIS — M8580 Other specified disorders of bone density and structure, unspecified site: Secondary | ICD-10-CM | POA: Insufficient documentation

## 2016-01-10 NOTE — Progress Notes (Signed)
Date:  01/10/2016   Name:  Ralph Salinas   DOB:  Aug 02, 1945   MRN:  [id number]  PCP:  No primary care provider on file.    Chief Complaint: Hypothyroidism; Hyperlipidemia; and Headache   History of Present Illness:  This is a 70 y.o. male seen for three month f/u. Back pain still bothering, sees chiro. Saw Dr. Jefm Bryant, received cortisone injection R knee, on Mobic, helping. On Cymbalta/Xanax per psych. Neck pain improved on meloxicam. Hx osteopenia/vit D def on supplement in past.  Review of Systems:  Review of Systems  Constitutional: Negative for fever and fatigue.  Respiratory: Negative for cough and shortness of breath.   Cardiovascular: Negative for chest pain and leg swelling.  Neurological: Negative for syncope and light-headedness.    Patient Active Problem List   Diagnosis Date Noted  . Osteopenia 01/10/2016  . Gonalgia 11/21/2015  . Benzodiazepine dependence ([place]) 10/10/2015  . Benign prostatic hyperplasia with urinary obstruction 03/04/2015  . Elevated prostate specific antigen (PSA) 03/04/2015  . Degeneration of intervertebral disc of cervical region 08/16/2014  . Anxiety 01/17/2014  . HLD (hyperlipidemia) 01/17/2014  . Hypothyroidism, postop 01/17/2014  . Chronic tension-type headache, intractable 01/16/2014  . Panic attack 01/16/2014    Prior to Admission medications   Medication Sig Start Date End Date Taking? Authorizing Provider  ALPRAZolam Duanne Moron) 1 MG tablet Take 1 mg by mouth 3 (three) times daily as needed.   Yes Historical Provider, MD  Ascorbic Acid (VITAMIN C) 1000 MG tablet Take 1,000 mg by mouth daily.   Yes Historical Provider, MD  DULoxetine (CYMBALTA) 60 MG capsule Take 1 capsule by mouth 2 (two) times daily. 09/27/15  Yes Historical Provider, MD  levothyroxine (SYNTHROID, LEVOTHROID) 137 MCG tablet Take 1 tablet by mouth daily. 01/29/15 01/29/16 Yes Historical Provider, MD  meloxicam (MOBIC) 15 MG tablet  12/11/15  Yes Historical Provider,  MD  simvastatin (ZOCOR) 40 MG tablet Take 1 tablet by mouth daily. 09/11/15  Yes Historical Provider, MD    No Known Allergies  Past Surgical History  Procedure Laterality Date  . Total thyroidectomy  1996  . Meniscus repair Right 2008    Social History  Substance Use Topics  . Smoking status: Never Smoker   . Smokeless tobacco: None  . Alcohol Use: No    Family History  Problem Relation Age of Onset  . Heart attack Mother   . Leukemia Father     Medication list has been reviewed and updated.  Physical Examination: BP 122/82 mmHg  Pulse 67  Temp(Src) 98 F (36.7 C) (Oral)  Resp 16  Ht 5\' 8"  (1.727 m)  Wt 189 lb 12.8 oz (86.093 kg)  BMI 28.87 kg/m2  SpO2 96%  Physical Exam  Constitutional: He appears well-developed and well-nourished.  Cardiovascular: Normal rate, regular rhythm and normal heart sounds.   Pulmonary/Chest: Effort normal and breath sounds normal.  Musculoskeletal: He exhibits no edema.  Neurological: He is alert.  Skin: Skin is warm and dry.  Psychiatric: He has a normal mood and affect. His behavior is normal.  Nursing note and vitals reviewed.   Assessment and Plan:  1. Degeneration of intervertebral disc of cervical region Discussed risks/benefits of long-term NSAID, continue for now  2. Hypothyroidism, postop Unclear control on Synthroid - TSH  3. Anxiety Adequate control on Cymbalta/Xanax, followed by psych - Comprehensive metabolic panel  4. HLD (hyperlipidemia) Unclear control on Zocor - Lipid Profile  5. Vitamin D deficiency Unclear control off supplement -  Vitamin D (25 hydroxy)  6. Med review Consider d/c vit C, Mobic, Zocor given lack of established CV dz   Return in about 6 months (around 07/11/2016).  Satira Anis. [facility]  01/10/2016

## 2016-01-11 LAB — COMPREHENSIVE METABOLIC PANEL
ALBUMIN: 4.3 g/dL (ref 3.6–4.8)
ALK PHOS: 68 IU/L (ref 39–117)
ALT: 17 IU/L (ref 0–44)
AST: 25 IU/L (ref 0–40)
Albumin/Globulin Ratio: 2.2 (ref 1.2–2.2)
BILIRUBIN TOTAL: 0.4 mg/dL (ref 0.0–1.2)
BUN/Creatinine Ratio: 27 — ABNORMAL HIGH (ref 10–24)
BUN: 24 mg/dL (ref 8–27)
CHLORIDE: 98 mmol/L (ref 96–106)
CO2: 25 mmol/L (ref 18–29)
Calcium: 8.7 mg/dL (ref 8.6–10.2)
Creatinine, Ser: 0.9 mg/dL (ref 0.76–1.27)
GFR calc Af Amer: 102 mL/min/{1.73_m2} (ref >59)
GFR calc non Af Amer: 88 mL/min/{1.73_m2} (ref >59)
GLOBULIN, TOTAL: 2 g/dL (ref 1.5–4.5)
Glucose: 78 mg/dL (ref 65–99)
POTASSIUM: 4.3 mmol/L (ref 3.5–5.2)
SODIUM: 141 mmol/L (ref 134–144)
Total Protein: 6.3 g/dL (ref 6.0–8.5)

## 2016-01-11 LAB — LIPID PANEL
CHOLESTEROL TOTAL: 222 mg/dL — AB (ref 100–199)
Chol/HDL Ratio: 3.5 ratio units (ref 0.0–5.0)
HDL: 63 mg/dL (ref >39)
LDL Calculated: 125 mg/dL — ABNORMAL HIGH (ref 0–99)
Triglycerides: 172 mg/dL — ABNORMAL HIGH (ref 0–149)
VLDL Cholesterol Cal: 34 mg/dL (ref 5–40)

## 2016-01-11 LAB — VITAMIN D 25 HYDROXY (VIT D DEFICIENCY, FRACTURES): VIT D 25 HYDROXY: 29.5 ng/mL — AB (ref 30.0–100.0)

## 2016-01-11 LAB — TSH: TSH: 7.89 u[IU]/mL — ABNORMAL HIGH (ref 0.450–4.500)

## 2016-01-11 MED ORDER — LEVOTHYROXINE SODIUM 150 MCG PO TABS: 150.0000 ug | ORAL_TABLET | Freq: Every day | ORAL | Status: DC

## 2016-01-11 MED ORDER — VITAMIN D3 25 MCG (1000 UT) PO CAPS: 1.0000 | ORAL_CAPSULE | Freq: Every day | ORAL | Status: DC

## 2016-01-11 NOTE — Addendum Note (Signed)
Addended by: Adline Potter on: 01/11/2016 01:13 PM   Modules accepted: Orders, Medications, SmartSet

## 2016-01-14 ENCOUNTER — Telehealth: Payer: Self-pay

## 2016-01-14 NOTE — Telephone Encounter (Signed)
Advised labs

## 2016-02-11 ENCOUNTER — Ambulatory Visit (INDEPENDENT_AMBULATORY_CARE_PROVIDER_SITE_OTHER): Payer: Medicare Other | Admitting: Internal Medicine

## 2016-02-11 ENCOUNTER — Encounter: Payer: Self-pay | Admitting: Internal Medicine

## 2016-02-11 VITALS — BP 134/80 | HR 72 | Ht 68.0 in | Wt 191.6 lb

## 2016-02-11 DIAGNOSIS — E559 Vitamin D deficiency, unspecified: Secondary | ICD-10-CM | POA: Diagnosis not present

## 2016-02-11 DIAGNOSIS — G4700 Insomnia, unspecified: Secondary | ICD-10-CM

## 2016-02-11 DIAGNOSIS — E890 Postprocedural hypothyroidism: Secondary | ICD-10-CM

## 2016-02-11 MED ORDER — LEVOTHYROXINE SODIUM 150 MCG PO TABS: 150.0000 ug | ORAL_TABLET | Freq: Every day | ORAL | 3 refills | Status: DC

## 2016-02-11 NOTE — Assessment & Plan Note (Signed)
Noted 12/2015

## 2016-02-11 NOTE — Progress Notes (Signed)
Date:  02/11/2016   Name:  Ralph Salinas   DOB:  Jan 05, 1946   MRN:  [id number]   Chief Complaint: Hypothyroidism (generic medication not effective) Patient of Dr. Vicente Masson - recently seen for Thyroid supplementation.  Taking generic but would like to take brand name.   Thyroid Problem  Presents for follow-up visit. Symptoms include fatigue. Patient reports no constipation, diarrhea, leg swelling, palpitations or weight gain. The symptoms have been worsening.      Review of Systems  Constitutional: Positive for fatigue. Negative for unexpected weight change and weight gain.  HENT: Negative for trouble swallowing.   Respiratory: Negative for chest tightness, shortness of breath and wheezing.   Cardiovascular: Negative for chest pain, palpitations and leg swelling.  Gastrointestinal: Negative for constipation, diarrhea and nausea.  Psychiatric/Behavioral: Negative for dysphoric mood and sleep disturbance.    Patient Active Problem List   Diagnosis Date Noted  . Vitamin D deficiency 02/11/2016  . Osteopenia 01/10/2016  . Gonalgia 11/21/2015  . Benzodiazepine dependence ([place]) 10/10/2015  . Benign prostatic hyperplasia with urinary obstruction 03/04/2015  . Elevated prostate specific antigen (PSA) 03/04/2015  . Degeneration of intervertebral disc of cervical region 08/16/2014  . Anxiety 01/17/2014  . Hyperlipidemia 01/17/2014  . Hypothyroidism, postop 01/17/2014  . Chronic tension-type headache, intractable 01/16/2014  . Panic attack 01/16/2014    Prior to Admission medications   Medication Sig Start Date End Date Taking? Authorizing Provider  ALPRAZolam Duanne Moron) 1 MG tablet Take 1 mg by mouth 3 (three) times daily as needed.   Yes Historical Provider, MD  Cholecalciferol (VITAMIN D3) 1000 units CAPS Take 1 capsule (1,000 Units total) by mouth daily. 01/11/16  Yes Adline Potter, MD  DULoxetine (CYMBALTA) 60 MG capsule Take 1 capsule by mouth daily. 09/27/15  Yes Historical  Provider, MD  levothyroxine (SYNTHROID, LEVOTHROID) 150 MCG tablet Take 1 tablet (150 mcg total) by mouth daily. 01/11/16  Yes Adline Potter, MD  meloxicam (MOBIC) 15 MG tablet  12/11/15  Yes Historical Provider, MD  simvastatin (ZOCOR) 40 MG tablet Take 1 tablet by mouth daily. 09/11/15  Yes Historical Provider, MD  zolpidem (AMBIEN CR) 12.5 MG CR tablet Take 12.5 mg by mouth at bedtime as needed. 01/20/16  Yes Historical Provider, MD    No Known Allergies  Past Surgical History:  Procedure Laterality Date  . MENISCUS REPAIR Right 2008  . TOTAL THYROIDECTOMY  1996    Social History  Substance Use Topics  . Smoking status: Never Smoker  . Smokeless tobacco: Not on file  . Alcohol use No     Medication list has been reviewed and updated.   Physical Exam  Constitutional: He is oriented to person, place, and time. He appears well-developed. No distress.  HENT:  Head: Normocephalic and atraumatic.  Cardiovascular: Normal rate and normal heart sounds.   Pulmonary/Chest: Effort normal and breath sounds normal. No respiratory distress.  Musculoskeletal: Normal range of motion. He exhibits edema (trace ankle).  Neurological: He is alert and oriented to person, place, and time.  Skin: Skin is warm and dry. No rash noted.  Psychiatric: He has a normal mood and affect. His behavior is normal. Thought content normal.  Nursing note and vitals reviewed.   BP (!) 142/78 (BP Location: Right Arm, Patient Position: Sitting, Cuff Size: Normal)   Pulse 72   Ht 5\' 8"  (1.727 m)   Wt 191 lb 9.6 oz (86.9 kg)   BMI 29.13 kg/m   Assessment and Plan: 1. Hypothyroidism,  postop Change to Brand synthroid and recheck labs in 3 mnths - levothyroxine (SYNTHROID, LEVOTHROID) 150 MCG tablet; Take 1 tablet (150 mcg total) by mouth daily.  Dispense: 90 tablet; Refill: 3  2. Vitamin D deficiency supplemented  3. Insomnia Advised on correct dosing of Ambien   Halina Maidens, MD [facility]  02/11/2016

## 2016-02-21 ENCOUNTER — Encounter: Payer: Self-pay | Admitting: Family Medicine

## 2016-02-21 ENCOUNTER — Other Ambulatory Visit: Payer: Self-pay | Admitting: Family Medicine

## 2016-02-21 ENCOUNTER — Encounter: Payer: Medicare Other | Admitting: Family Medicine

## 2016-02-21 DIAGNOSIS — E039 Hypothyroidism, unspecified: Secondary | ICD-10-CM | POA: Diagnosis not present

## 2016-02-22 LAB — TSH: TSH: 3.31 u[IU]/mL (ref 0.450–4.500)

## 2016-02-27 ENCOUNTER — Other Ambulatory Visit: Payer: Self-pay

## 2016-02-28 ENCOUNTER — Encounter: Payer: Self-pay | Admitting: Internal Medicine

## 2016-02-28 ENCOUNTER — Ambulatory Visit (INDEPENDENT_AMBULATORY_CARE_PROVIDER_SITE_OTHER): Payer: Medicare Other | Admitting: Internal Medicine

## 2016-02-28 VITALS — BP 140/82 | HR 74 | Ht 68.0 in | Wt 190.0 lb

## 2016-02-28 DIAGNOSIS — E559 Vitamin D deficiency, unspecified: Secondary | ICD-10-CM

## 2016-02-28 DIAGNOSIS — F419 Anxiety disorder, unspecified: Secondary | ICD-10-CM | POA: Diagnosis not present

## 2016-02-28 DIAGNOSIS — E890 Postprocedural hypothyroidism: Secondary | ICD-10-CM | POA: Diagnosis not present

## 2016-02-28 DIAGNOSIS — R5383 Other fatigue: Secondary | ICD-10-CM

## 2016-02-28 NOTE — Progress Notes (Signed)
Date:  02/28/2016   Name:  Ralph Salinas   DOB:  1946/03/24   MRN:  [id number]   Chief Complaint: Fatigue and Depression (Since cuttng back on meds he has felt bad.)  Fatigue - he has tried to change his psych meds on his own.  Stopped Effexor for three weeks but started to feel very tired.  He also stopped Buspar.  Last week he started back on both medications and was feeling improved for a few days.  Now more headaches.   Depression         This is a chronic problem.  The current episode started more than 1 year ago.   Progression since onset: worse since medication changes made by psychiatry.  Associated symptoms include fatigue and headaches.  Associated symptoms include no decreased concentration and no suicidal ideas.  Past treatments include SNRIs - Serotonin and norepinephrine reuptake inhibitors and other medications.  Past medical history includes thyroid problem.   Thyroid Problem  Presents for follow-up visit. Symptoms include anxiety and fatigue. Patient reports no palpitations or tremors. The symptoms have been stable (last TSH was normal).   Lab Results  Component Value Date   TSH 3.310 02/21/2016     Review of Systems  Constitutional: Positive for fatigue. Negative for chills, fever and unexpected weight change.  HENT: Negative for trouble swallowing.   Eyes: Negative for visual disturbance.  Respiratory: Negative for cough, chest tightness, shortness of breath and wheezing.   Cardiovascular: Negative for chest pain, palpitations and leg swelling.  Gastrointestinal: Negative for abdominal pain, nausea and vomiting.  Musculoskeletal: Positive for arthralgias and neck pain.  Neurological: Positive for headaches. Negative for dizziness, tremors and syncope.  Psychiatric/Behavioral: Positive for depression and dysphoric mood. Negative for decreased concentration, hallucinations, sleep disturbance and suicidal ideas. The patient is nervous/anxious.     Patient  Active Problem List   Diagnosis Date Noted  . Vitamin D deficiency 02/11/2016  . Insomnia 02/11/2016  . Osteopenia 01/10/2016  . Gonalgia 11/21/2015  . Benzodiazepine dependence ([place]) 10/10/2015  . Benign prostatic hyperplasia with urinary obstruction 03/04/2015  . Elevated prostate specific antigen (PSA) 03/04/2015  . Degeneration of intervertebral disc of cervical region 08/16/2014  . Anxiety 01/17/2014  . Hyperlipidemia 01/17/2014  . Hypothyroidism, postop 01/17/2014  . Chronic tension-type headache, intractable 01/16/2014  . Panic attack 01/16/2014    Prior to Admission medications   Medication Sig Start Date End Date Taking? Authorizing Provider  ALPRAZolam Duanne Moron) 1 MG tablet Take 1 mg by mouth 2 (two) times daily as needed.    Yes Historical Provider, MD  Cholecalciferol (VITAMIN D3) 1000 units CAPS Take 1 capsule (1,000 Units total) by mouth daily. 01/11/16  Yes Adline Potter, MD  DULoxetine (CYMBALTA) 60 MG capsule Take 1 capsule by mouth daily. 09/27/15  Yes Historical Provider, MD  levothyroxine (SYNTHROID, LEVOTHROID) 150 MCG tablet Take 1 tablet (150 mcg total) by mouth daily. 02/11/16  Yes Glean Hess, MD  meloxicam (MOBIC) 15 MG tablet TAKE ONE TABLET BY MOUTH ONCE DAILY 02/18/16  Yes Historical Provider, MD  simvastatin (ZOCOR) 40 MG tablet Take 1 tablet by mouth daily. 09/11/15  Yes Historical Provider, MD  zolpidem (AMBIEN CR) 12.5 MG CR tablet Take 12.5 mg by mouth at bedtime as needed. 01/20/16  Yes Historical Provider, MD    No Known Allergies  Past Surgical History:  Procedure Laterality Date  . MENISCUS REPAIR Right 2008  . TOTAL THYROIDECTOMY  1996   hyperthyroidism  Social History  Substance Use Topics  . Smoking status: Never Smoker  . Smokeless tobacco: Never Used  . Alcohol use No     Medication list has been reviewed and updated.   Physical Exam  Constitutional: He is oriented to person, place, and time. He appears well-developed. No  distress.  HENT:  Head: Normocephalic and atraumatic.  Pulmonary/Chest: Effort normal. No respiratory distress.  Musculoskeletal: Normal range of motion.  Neurological: He is alert and oriented to person, place, and time.  Skin: Skin is warm and dry. No rash noted.  Psychiatric: He has a normal mood and affect. His behavior is normal. Thought content normal.    BP 140/82   Pulse 74   Ht 5\' 8"  (1.727 m)   Wt 190 lb (86.2 kg)   SpO2 100%   BMI 28.89 kg/m   Assessment and Plan: 1. Hypothyroidism, postop supplemented  2. Anxiety Fair control - follow up with Psychiatry  3. Vitamin D deficiency supplemented  4. Other fatigue Uncertain etiology - possibly from medication changes Rule out TA cause of headaches - CBC with Differential/Platelet - Comprehensive metabolic panel - Sedimentation rate   Halina Maidens, MD [facility]  02/28/2016

## 2016-02-29 LAB — CBC WITH DIFFERENTIAL/PLATELET
BASOS: 1 %
Basophils Absolute: 0 10*3/uL (ref 0.0–0.2)
EOS (ABSOLUTE): 0.1 10*3/uL (ref 0.0–0.4)
EOS: 4 %
HEMATOCRIT: 45.6 % (ref 37.5–51.0)
Hemoglobin: 15.3 g/dL (ref 12.6–17.7)
Immature Grans (Abs): 0 10*3/uL (ref 0.0–0.1)
Immature Granulocytes: 0 %
LYMPHS ABS: 1 10*3/uL (ref 0.7–3.1)
Lymphs: 26 %
MCH: 31.2 pg (ref 26.6–33.0)
MCHC: 33.6 g/dL (ref 31.5–35.7)
MCV: 93 fL (ref 79–97)
MONOS ABS: 0.4 10*3/uL (ref 0.1–0.9)
Monocytes: 10 %
NEUTROS PCT: 59 %
Neutrophils Absolute: 2.3 10*3/uL (ref 1.4–7.0)
PLATELETS: 179 10*3/uL (ref 150–379)
RBC: 4.91 x10E6/uL (ref 4.14–5.80)
RDW: 13.4 % (ref 12.3–15.4)
WBC: 3.8 10*3/uL (ref 3.4–10.8)

## 2016-02-29 LAB — COMPREHENSIVE METABOLIC PANEL
A/G RATIO: 2.4 — AB (ref 1.2–2.2)
ALK PHOS: 75 IU/L (ref 39–117)
ALT: 19 IU/L (ref 0–44)
AST: 21 IU/L (ref 0–40)
Albumin: 4.3 g/dL (ref 3.6–4.8)
BUN/Creatinine Ratio: 24 (ref 10–24)
BUN: 24 mg/dL (ref 8–27)
Bilirubin Total: 0.4 mg/dL (ref 0.0–1.2)
CHLORIDE: 98 mmol/L (ref 96–106)
CO2: 26 mmol/L (ref 18–29)
Calcium: 8.9 mg/dL (ref 8.6–10.2)
Creatinine, Ser: 1 mg/dL (ref 0.76–1.27)
GFR calc Af Amer: 89 mL/min/{1.73_m2} (ref >59)
GFR calc non Af Amer: 77 mL/min/{1.73_m2} (ref >59)
GLOBULIN, TOTAL: 1.8 g/dL (ref 1.5–4.5)
Glucose: 90 mg/dL (ref 65–99)
POTASSIUM: 4.7 mmol/L (ref 3.5–5.2)
SODIUM: 141 mmol/L (ref 134–144)
Total Protein: 6.1 g/dL (ref 6.0–8.5)

## 2016-02-29 LAB — SEDIMENTATION RATE: SED RATE: 2 mm/h (ref 0–30)

## 2016-04-07 DIAGNOSIS — F411 Generalized anxiety disorder: Secondary | ICD-10-CM | POA: Diagnosis not present

## 2016-04-07 DIAGNOSIS — F410 Panic disorder [episodic paroxysmal anxiety] without agoraphobia: Secondary | ICD-10-CM | POA: Diagnosis not present

## 2016-04-07 DIAGNOSIS — F4010 Social phobia, unspecified: Secondary | ICD-10-CM | POA: Diagnosis not present

## 2016-04-07 DIAGNOSIS — F329 Major depressive disorder, single episode, unspecified: Secondary | ICD-10-CM | POA: Diagnosis not present

## 2016-04-08 IMAGING — MR MRI CERVICAL SPINE WITHOUT CONTRAST
5 series · 33 of 48 positions shown · non-contrast
Comparison: None.

CLINICAL DATA: Right neck pain and headaches for 6-8 months.

EXAM:
MRI CERVICAL SPINE WITHOUT CONTRAST
TECHNIQUE: Multiplanar, multisequence MR imaging of the cervical spine was
performed. No intravenous contrast was administered.

[Series 2: T2 · sagittal · 3.0mm · 0.56mm/px · 8 of 15 slices shown (1 of 2)]
[im 1/15]
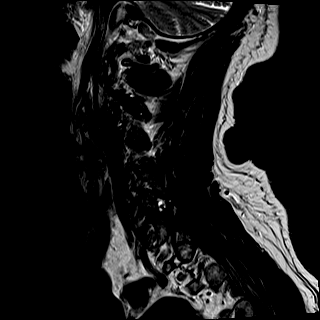
[im 3/15]
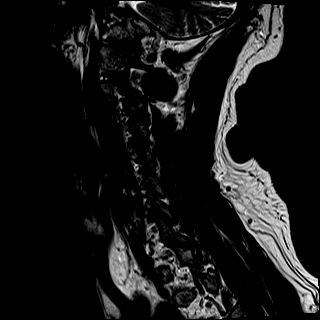
[im 5/15]
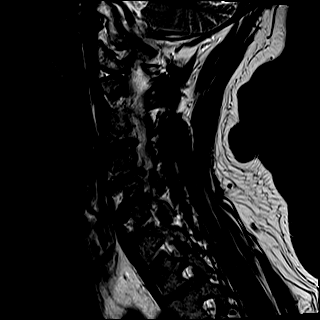
[im 7/15]
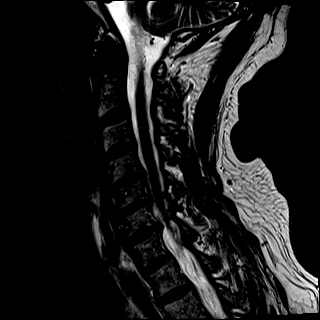
[im 9/15]
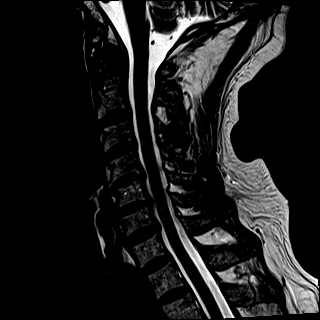
[im 11/15]
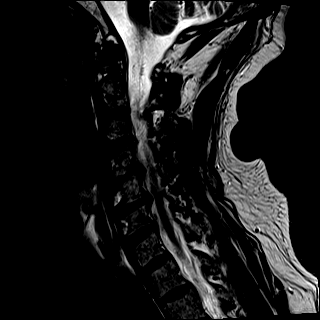
[im 13/15]
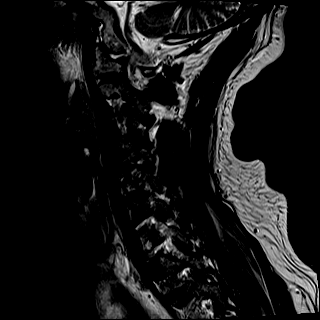
[im 15/15]
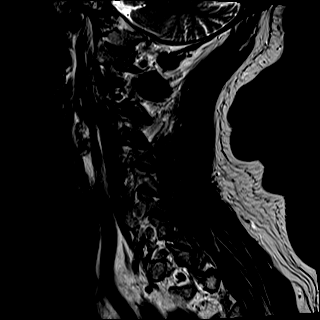

[Series 3: T1 · sagittal · 3.0mm · 0.56mm/px · 7 of 15 slices shown]
[im 1/15]
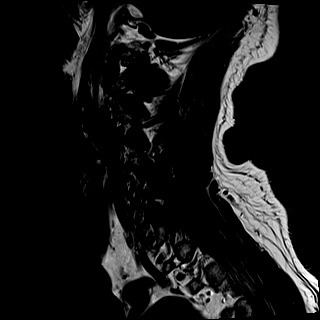
[im 3/15]
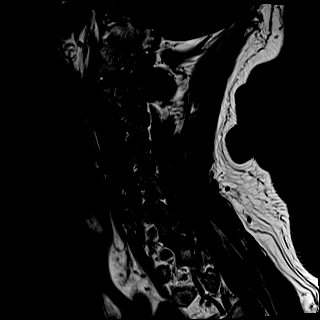
[im 5/15]
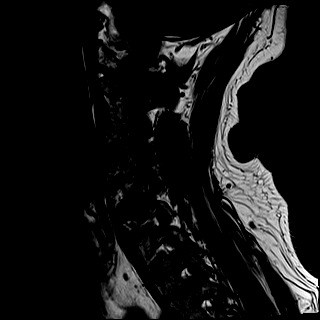
[im 8/15]
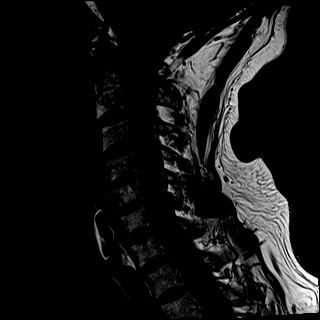
[im 10/15]
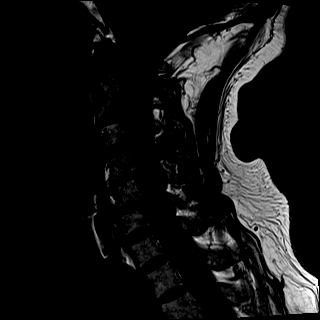
[im 12/15]
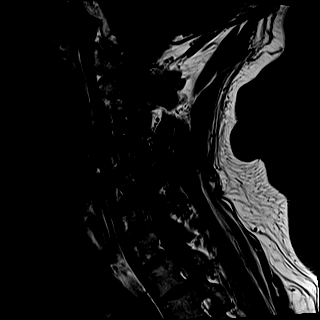
[im 15/15]
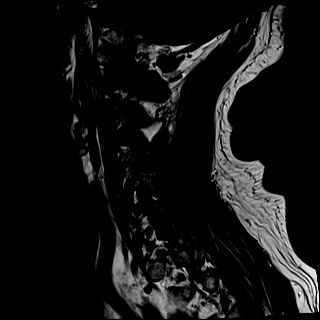

[Series 4: STIR · sagittal · 3.0mm · 0.35mm/px · 7 of 15 slices shown]
[im 1/15]
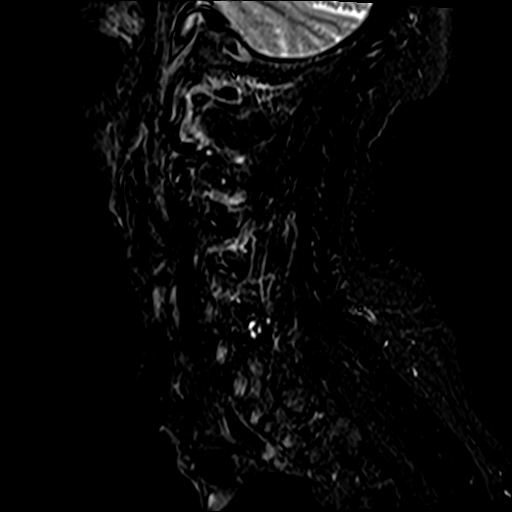
[im 3/15]
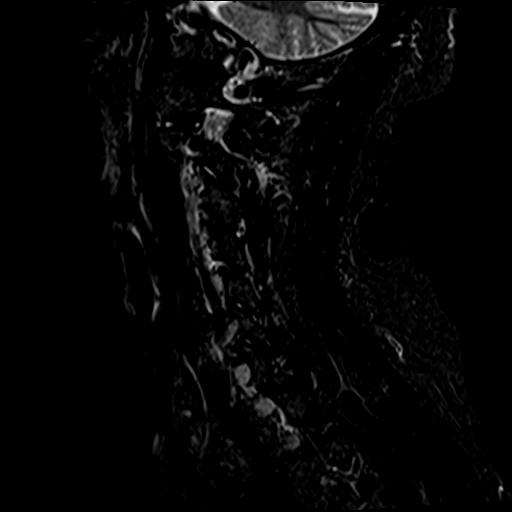
[im 5/15]
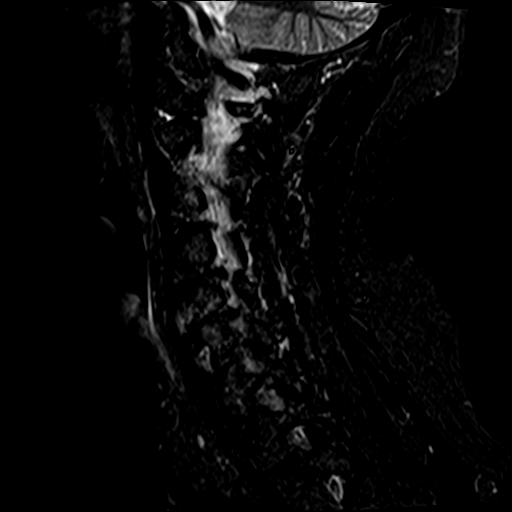
[im 8/15]
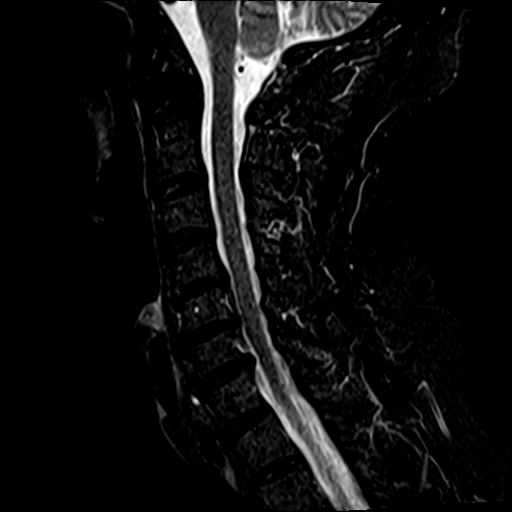
[im 10/15]
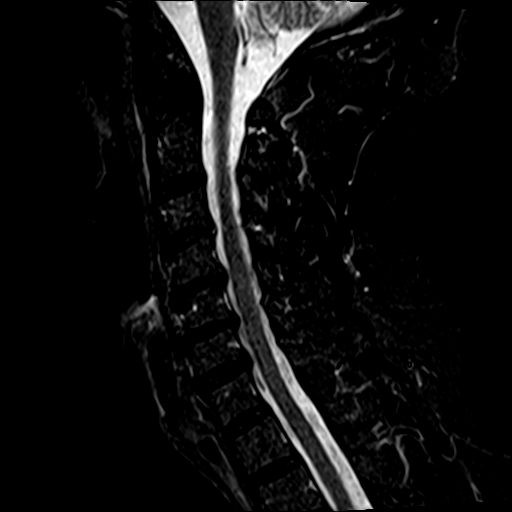
[im 12/15]
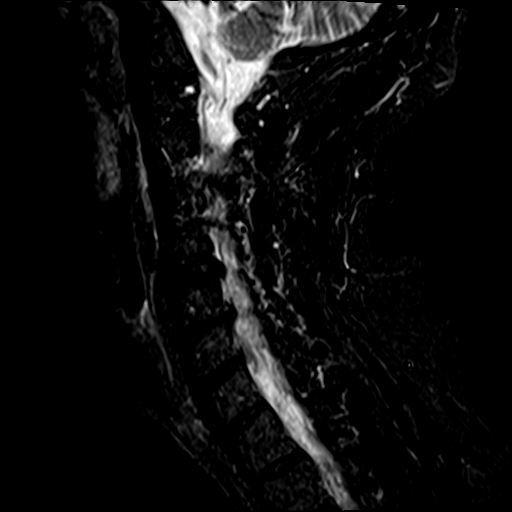
[im 15/15]
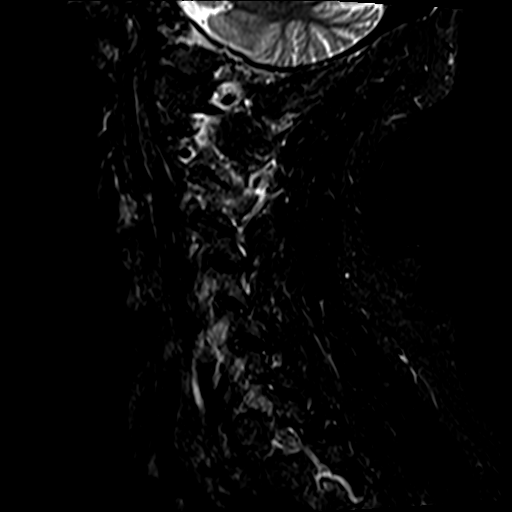

[Series 5: T2 · axial · 3.0mm · 0.62mm/px · z∈[-84,+17]mm · 9 of 28 slices shown (2 of 2)]
[im 1/28]
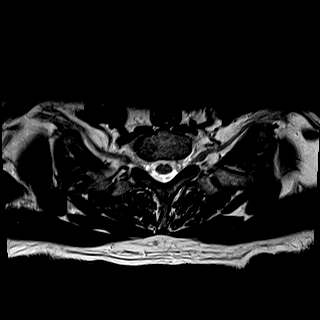
[im 5/28]
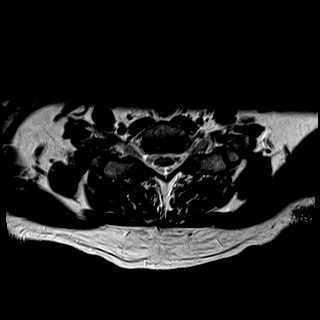
[im 10/28]
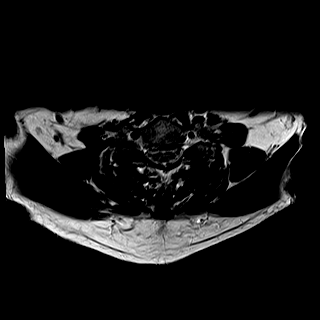
[im 12/28]
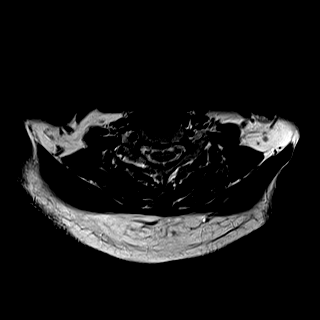
[im 14/28]
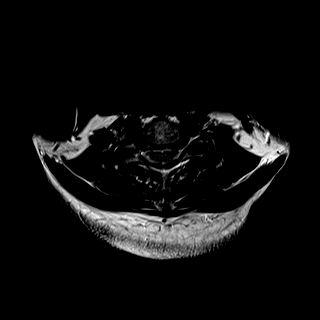
[im 16/28]
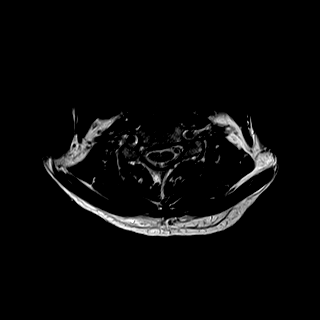
[im 19/28]
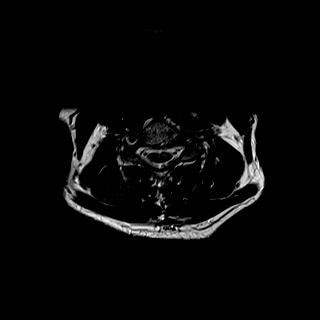
[im 23/28]
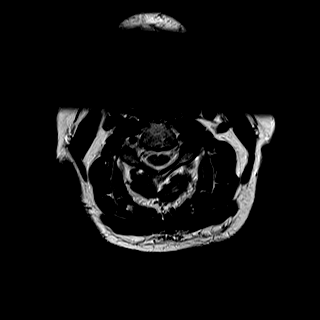
[im 28/28]
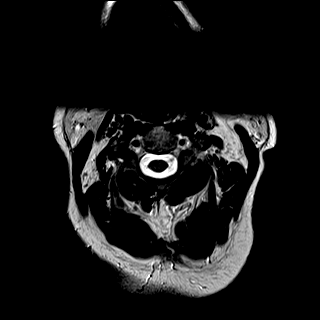

[Series 6: mpgr ax · axial · 3.0mm · 0.35mm/px · z∈[-76,-61]mm · 2 of 28 slices shown]
[im 1/28]
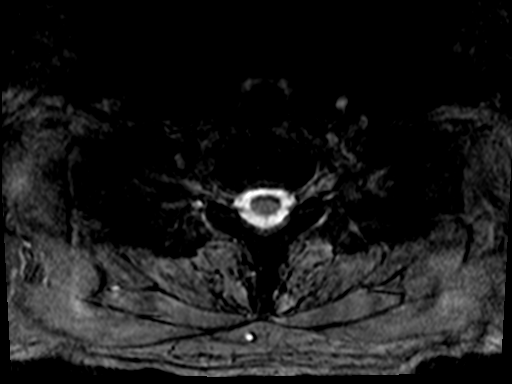
[im 5/28]
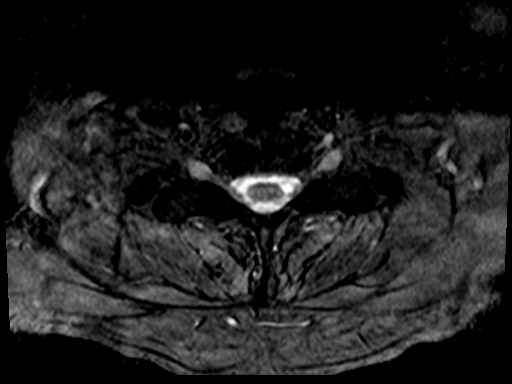

[33 of 48 positions shown; findings below may reference images not displayed]

FINDINGS: Vertebral body height and alignment are unremarkable. Very mild
degenerative endplate signal change is seen at C4-5 and C5-6. The
craniocervical junction is normal and cervical cord signal is
normal.

C2-3: There is left worse than right facet arthropathy with mild
marrow edema about the left facet joint. The central canal and
foramina are open.

C3-4: Very mild disc bulge and mild facet degenerative change. The
central canal and foramina are open.

C4-5: There is a disc osteophyte complex and bilateral uncovertebral
disease causing mild central canal and mild to moderate foraminal
narrowing, more notable on the left. The right foramen is open.

C5-6: There is a shallow disc bulge and uncovertebral disease. There
is slight deformity of the ventral cord and moderate bilateral
foraminal narrowing.

C6-7: There is a small right paracentral disc protrusion but the
central canal and foramina appear open.

C7-T1:  Negative.
IMPRESSION: Facet degenerative change most notable on the left at C2-3 where
there is mild marrow edema about the joint.

Shallow disc bulge uncovertebral disease at C4-5 results in mild
central canal and mild to moderate left foraminal narrowing.

Slight deformity of the ventral cord moderate bilateral foraminal
narrowing C5-6 due to a shallow disc bulge and uncovertebral
disease.

Small right paracentral protrusion C6-7 without central canal or
foraminal narrowing.

## 2016-04-13 ENCOUNTER — Telehealth: Payer: Self-pay

## 2016-04-13 MED ORDER — SIMVASTATIN 40 MG PO TABS: 40.0000 mg | ORAL_TABLET | Freq: Every day | ORAL | 2 refills | Status: DC

## 2016-04-13 NOTE — Telephone Encounter (Signed)
Patient is running out of his Simvastatin 40 mg. Patient stated that Dr.Plonk hasn't filled this medication yet but it will need to be filled by Dr.Berglund and not his old PCP at [facility]. Please Advise.  [place] in [place]

## 2016-04-13 NOTE — Telephone Encounter (Signed)
SENT REFILLS

## 2016-05-13 ENCOUNTER — Encounter: Payer: Self-pay | Admitting: Internal Medicine

## 2016-05-13 ENCOUNTER — Ambulatory Visit (INDEPENDENT_AMBULATORY_CARE_PROVIDER_SITE_OTHER): Payer: Medicare Other | Admitting: Internal Medicine

## 2016-05-13 VITALS — BP 126/78 | HR 76 | Ht 68.0 in | Wt 194.0 lb

## 2016-05-13 DIAGNOSIS — F419 Anxiety disorder, unspecified: Secondary | ICD-10-CM | POA: Diagnosis not present

## 2016-05-13 DIAGNOSIS — Z23 Encounter for immunization: Secondary | ICD-10-CM | POA: Diagnosis not present

## 2016-05-13 DIAGNOSIS — E782 Mixed hyperlipidemia: Secondary | ICD-10-CM | POA: Diagnosis not present

## 2016-05-13 DIAGNOSIS — E890 Postprocedural hypothyroidism: Secondary | ICD-10-CM

## 2016-05-13 DIAGNOSIS — M5030 Other cervical disc degeneration, unspecified cervical region: Secondary | ICD-10-CM

## 2016-05-13 MED ORDER — MELOXICAM 15 MG PO TABS: 15.0000 mg | ORAL_TABLET | Freq: Every day | ORAL | 3 refills | Status: DC

## 2016-05-13 NOTE — Progress Notes (Signed)
Date:  05/13/2016   Name:  Ralph Salinas   DOB:  [birth year]   MRN:  [id number]   Chief Complaint: Follow-up (told to follow up concerning "not feeling well") and Neck Pain (wants to go back on Meloxicam- Rx'd by Jefm Bryant for knee pain originally) Thyroid Problem  Presents for follow-up visit. Patient reports no fatigue, palpitations or tremors. The symptoms have been stable. His past medical history is significant for hyperlipidemia.  Hyperlipidemia  This is a chronic problem. Recent lipid tests were reviewed and are variable. Pertinent negatives include no chest pain, myalgias or shortness of breath. Current antihyperlipidemic treatment includes statins.  Neck Pain   This is a chronic problem. The current episode started more than 1 year ago. The problem occurs daily. The problem has been waxing and waning (known bulging discs in neck). The pain is present in the midline. The pain is mild. Pertinent negatives include no chest pain, headaches or numbness. He has tried NSAIDs for the symptoms.  Anxiety - attributed to stopping Effexor - he is now tapering and planning to continue on cymbalta alone.  He is followed by Dr. Myer Haff.   Review of Systems  Constitutional: Negative for appetite change, fatigue and unexpected weight change.  Eyes: Negative for visual disturbance.  Respiratory: Negative for cough, shortness of breath and wheezing.   Cardiovascular: Negative for chest pain, palpitations and leg swelling.  Gastrointestinal: Negative for abdominal pain and blood in stool.  Endocrine: Negative for polydipsia and polyuria.  Genitourinary: Negative for dysuria and hematuria.  Musculoskeletal: Positive for arthralgias (right knee) and neck pain. Negative for myalgias.  Skin: Negative for color change and rash.  Neurological: Negative for tremors, numbness and headaches.  Psychiatric/Behavioral: Negative for dysphoric mood.    Patient Active Problem List   Diagnosis Date  Noted  . Vitamin D deficiency 02/11/2016  . Insomnia 02/11/2016  . Osteopenia 01/10/2016  . Gonalgia 11/21/2015  . Benzodiazepine dependence ([place]) 10/10/2015  . Benign prostatic hyperplasia with urinary obstruction 03/04/2015  . Elevated prostate specific antigen (PSA) 03/04/2015  . Degeneration of intervertebral disc of cervical region 08/16/2014  . Anxiety 01/17/2014  . Hyperlipidemia 01/17/2014  . Hypothyroidism, postop 01/17/2014  . Chronic tension-type headache, intractable 01/16/2014  . Panic attack 01/16/2014    Prior to Admission medications   Medication Sig Start Date End Date Taking? Authorizing Provider  ALPRAZolam Duanne Moron) 1 MG tablet Take 1 mg by mouth 2 (two) times daily as needed.    Yes Historical Provider, MD  busPIRone (BUSPAR) 15 MG tablet Take 15 mg by mouth 2 (two) times daily.   Yes Historical Provider, MD  Cholecalciferol (VITAMIN D3) 1000 units CAPS Take 1 capsule (1,000 Units total) by mouth daily. 01/11/16  Yes Adline Potter, MD  DULoxetine (CYMBALTA) 60 MG capsule Take 1 capsule by mouth daily. 09/27/15  Yes Historical Provider, MD  levothyroxine (SYNTHROID, LEVOTHROID) 150 MCG tablet Take 1 tablet (150 mcg total) by mouth daily. 02/11/16  Yes Glean Hess, MD  simvastatin (ZOCOR) 40 MG tablet Take 1 tablet (40 mg total) by mouth daily. 04/13/16  Yes Glean Hess, MD  venlafaxine XR (EFFEXOR-XR) 75 MG 24 hr capsule Take 37.5 mg by mouth daily with breakfast. Pt tapering off   Yes Historical Provider, MD  meloxicam (MOBIC) 15 MG tablet TAKE ONE TABLET BY MOUTH ONCE DAILY 02/18/16   Historical Provider, MD  zolpidem (AMBIEN CR) 12.5 MG CR tablet Take 12.5 mg by mouth at bedtime as needed.  01/20/16   Historical Provider, MD    No Known Allergies  Past Surgical History:  Procedure Laterality Date  . MENISCUS REPAIR Right 2008  . TOTAL THYROIDECTOMY  1996   hyperthyroidism    Social History  Substance Use Topics  . Smoking status: Never Smoker  .  Smokeless tobacco: Never Used  . Alcohol use No     Medication list has been reviewed and updated.   Physical Exam  Constitutional: He is oriented to person, place, and time. He appears well-developed. No distress.  HENT:  Head: Normocephalic and atraumatic.  Neck: Normal range of motion. Neck supple. Carotid bruit is not present.  Cardiovascular: Normal rate, regular rhythm and normal heart sounds.   Pulmonary/Chest: Effort normal and breath sounds normal. No respiratory distress.  Musculoskeletal: Normal range of motion.  Neurological: He is alert and oriented to person, place, and time.  Reflex Scores:      Bicep reflexes are 1+ on the right side and 1+ on the left side.      Patellar reflexes are 1+ on the right side and 1+ on the left side. Skin: Skin is warm and dry. No rash noted.  Psychiatric: He has a normal mood and affect. His behavior is normal. Thought content normal. His mood appears not anxious. He does not exhibit a depressed mood.  Nursing note and vitals reviewed.   BP 126/78   Pulse 76   Ht 5\' 8"  (1.727 m)   Wt 194 lb (88 kg)   BMI 29.50 kg/m   Assessment and Plan: 1. Hypothyroidism, postop Supplemented - check neck visit  2. Mixed hyperlipidemia On statin therapy  3. Degeneration of intervertebral disc of cervical region Continue heat, ice and massage therapy - meloxicam (MOBIC) 15 MG tablet; Take 1 tablet (15 mg total) by mouth daily.  Dispense: 90 tablet; Refill: 3  4. Anxiety Doing well with medications - followed by Dr. Tressie Ellis, MD [facility]  05/13/2016

## 2016-05-26 DIAGNOSIS — M170 Bilateral primary osteoarthritis of knee: Secondary | ICD-10-CM | POA: Diagnosis not present

## 2016-05-26 DIAGNOSIS — M25561 Pain in right knee: Secondary | ICD-10-CM | POA: Diagnosis not present

## 2016-05-26 DIAGNOSIS — G8929 Other chronic pain: Secondary | ICD-10-CM | POA: Diagnosis not present

## 2016-05-26 DIAGNOSIS — M25562 Pain in left knee: Secondary | ICD-10-CM | POA: Diagnosis not present

## 2016-06-02 DIAGNOSIS — F329 Major depressive disorder, single episode, unspecified: Secondary | ICD-10-CM | POA: Diagnosis not present

## 2016-06-02 DIAGNOSIS — F411 Generalized anxiety disorder: Secondary | ICD-10-CM | POA: Diagnosis not present

## 2016-06-02 DIAGNOSIS — F410 Panic disorder [episodic paroxysmal anxiety] without agoraphobia: Secondary | ICD-10-CM | POA: Diagnosis not present

## 2016-06-02 DIAGNOSIS — F4010 Social phobia, unspecified: Secondary | ICD-10-CM | POA: Diagnosis not present

## 2016-07-10 ENCOUNTER — Encounter: Payer: Self-pay | Admitting: Family Medicine

## 2016-07-10 ENCOUNTER — Ambulatory Visit (INDEPENDENT_AMBULATORY_CARE_PROVIDER_SITE_OTHER): Payer: Medicare Other | Admitting: Family Medicine

## 2016-07-10 VITALS — BP 138/82 | HR 76 | Resp 16 | Ht 68.0 in | Wt 192.4 lb

## 2016-07-10 DIAGNOSIS — R972 Elevated prostate specific antigen [PSA]: Secondary | ICD-10-CM

## 2016-07-10 DIAGNOSIS — E890 Postprocedural hypothyroidism: Secondary | ICD-10-CM

## 2016-07-10 DIAGNOSIS — E785 Hyperlipidemia, unspecified: Secondary | ICD-10-CM | POA: Diagnosis not present

## 2016-07-10 DIAGNOSIS — E559 Vitamin D deficiency, unspecified: Secondary | ICD-10-CM | POA: Diagnosis not present

## 2016-07-10 DIAGNOSIS — M5030 Other cervical disc degeneration, unspecified cervical region: Secondary | ICD-10-CM | POA: Diagnosis not present

## 2016-07-10 DIAGNOSIS — F419 Anxiety disorder, unspecified: Secondary | ICD-10-CM | POA: Diagnosis not present

## 2016-07-10 NOTE — Progress Notes (Signed)
Date:  07/10/2016   Name:  Ralph Salinas   DOB:  Nov 24, 1945   MRN:  [id number]  PCP:  No primary care provider on file.    Chief Complaint: Hypothyroidism   History of Present Illness:  This is a 70 y.o. male seen for six month f/u. Fatigue improved, thinks due to psych meds but still c/o lack of motivation. Saw ortho for knee pain, received cortisone injections which helped. Still having HA's he related to cervical disc dz, on Celebrex per ortho, also sees chiro, takes [facility] powders prn which help. Seeing psych for anxiety, now on Cymbalta after Effexor wean. Synthroid dose increased in June, TSH normal in August. VIt D level low in June, on supplement. Hx elevated PSA, has seen [facility] urology, not checked recently.  Review of Systems:  Review of Systems  Constitutional: Negative for activity change, appetite change, fever and unexpected weight change.  Respiratory: Negative for cough and shortness of breath.   Cardiovascular: Negative for chest pain and leg swelling.  Gastrointestinal: Negative for abdominal pain.    Patient Active Problem List   Diagnosis Date Noted  . Vitamin D deficiency 02/11/2016  . Insomnia 02/11/2016  . Osteopenia 01/10/2016  . Gonalgia 11/21/2015  . Benzodiazepine dependence ([place]) 10/10/2015  . Benign prostatic hyperplasia with urinary obstruction 03/04/2015  . Elevated prostate specific antigen (PSA) 03/04/2015  . Degeneration of intervertebral disc of cervical region 08/16/2014  . Anxiety 01/17/2014  . Hyperlipidemia 01/17/2014  . Hypothyroidism, postop 01/17/2014  . Chronic tension-type headache, intractable 01/16/2014  . Panic attack 01/16/2014    Prior to Admission medications   Medication Sig Start Date End Date Taking? Authorizing Provider  ALPRAZolam Duanne Moron) 1 MG tablet Take 1 mg by mouth 2 (two) times daily as needed.    Yes Historical Provider, MD  busPIRone (BUSPAR) 15 MG tablet Take 15 mg by mouth 2 (two) times daily.   Yes Historical  Provider, MD  celecoxib (CELEBREX) 200 MG capsule Take by mouth. 05/26/16  Yes Historical Provider, MD  Cholecalciferol (VITAMIN D3) 1000 units CAPS Take 1 capsule (1,000 Units total) by mouth daily. 01/11/16  Yes Adline Potter, MD  DULoxetine (CYMBALTA) 60 MG capsule Take 1 capsule by mouth daily. 09/27/15  Yes Historical Provider, MD  levothyroxine (SYNTHROID, LEVOTHROID) 150 MCG tablet Take 1 tablet (150 mcg total) by mouth daily. 02/11/16  Yes Glean Hess, MD  simvastatin (ZOCOR) 40 MG tablet Take 1 tablet (40 mg total) by mouth daily. 04/13/16  Yes Glean Hess, MD    No Known Allergies  Past Surgical History:  Procedure Laterality Date  . MENISCUS REPAIR Right 2008  . TOTAL THYROIDECTOMY  1996   hyperthyroidism    Social History  Substance Use Topics  . Smoking status: Never Smoker  . Smokeless tobacco: Never Used  . Alcohol use No    Family History  Problem Relation Age of Onset  . Heart attack Mother   . Leukemia Father     Medication list has been reviewed and updated.  Physical Examination: BP 138/82   Pulse 76   Resp 16   Ht 5\' 8"  (1.727 m)   Wt 192 lb 6.4 oz (87.3 kg)   BMI 29.25 kg/m   Physical Exam  Constitutional: He appears well-developed and well-nourished.  Cardiovascular: Normal rate, regular rhythm and normal heart sounds.   Pulmonary/Chest: Effort normal and breath sounds normal.  Musculoskeletal: He exhibits no edema.  Neurological: He is alert.  Skin: Skin is warm  and dry.  Psychiatric: He has a normal mood and affect. His behavior is normal.  Nursing note and vitals reviewed.   Assessment and Plan:  1. Hypothyroidism, postop On increased dose Synthroid, consider adding T3 if fatigue persists - TSH  2. Degeneration of intervertebral disc of cervical region Discussed changing Celebrex to Tylenol bid, wishes to cont Celebrex for now  3. Hyperlipidemia, unspecified hyperlipidemia type On Zocor, 10 yr CVR 15% but no known CVD,  consider d/c as may be contributing to fatigue  4. Vitamin D deficiency On supplement - Vitamin D (25 hydroxy)  5. Anxiety Well controlled per psych  6. Elevated prostate specific antigen (PSA) Has seen [facility] urology in past - PSA  Return in about 6 months (around 01/08/2017).  Satira Anis. Nathania Waldman, [facility]  07/10/2016

## 2016-07-11 LAB — VITAMIN D 25 HYDROXY (VIT D DEFICIENCY, FRACTURES): VIT D 25 HYDROXY: 32.4 ng/mL (ref 30.0–100.0)

## 2016-07-11 LAB — PSA: PROSTATE SPECIFIC AG, SERUM: 3.3 ng/mL (ref 0.0–4.0)

## 2016-07-11 LAB — TSH: TSH: 0.759 u[IU]/mL (ref 0.450–4.500)

## 2016-07-15 ENCOUNTER — Other Ambulatory Visit: Payer: Self-pay | Admitting: Family Medicine

## 2016-07-30 DIAGNOSIS — F329 Major depressive disorder, single episode, unspecified: Secondary | ICD-10-CM | POA: Diagnosis not present

## 2016-07-30 DIAGNOSIS — F410 Panic disorder [episodic paroxysmal anxiety] without agoraphobia: Secondary | ICD-10-CM | POA: Diagnosis not present

## 2016-07-30 DIAGNOSIS — F411 Generalized anxiety disorder: Secondary | ICD-10-CM | POA: Diagnosis not present

## 2016-09-10 DIAGNOSIS — F411 Generalized anxiety disorder: Secondary | ICD-10-CM | POA: Diagnosis not present

## 2016-09-10 DIAGNOSIS — F410 Panic disorder [episodic paroxysmal anxiety] without agoraphobia: Secondary | ICD-10-CM | POA: Diagnosis not present

## 2016-09-10 DIAGNOSIS — F329 Major depressive disorder, single episode, unspecified: Secondary | ICD-10-CM | POA: Diagnosis not present

## 2016-10-21 DIAGNOSIS — F4010 Social phobia, unspecified: Secondary | ICD-10-CM | POA: Diagnosis not present

## 2016-10-21 DIAGNOSIS — F411 Generalized anxiety disorder: Secondary | ICD-10-CM | POA: Diagnosis not present

## 2016-10-21 DIAGNOSIS — Z79899 Other long term (current) drug therapy: Secondary | ICD-10-CM | POA: Diagnosis not present

## 2016-10-21 DIAGNOSIS — F329 Major depressive disorder, single episode, unspecified: Secondary | ICD-10-CM | POA: Diagnosis not present

## 2016-10-21 DIAGNOSIS — F410 Panic disorder [episodic paroxysmal anxiety] without agoraphobia: Secondary | ICD-10-CM | POA: Diagnosis not present

## 2016-10-22 ENCOUNTER — Encounter: Payer: Self-pay | Admitting: Family Medicine

## 2016-10-22 ENCOUNTER — Ambulatory Visit (INDEPENDENT_AMBULATORY_CARE_PROVIDER_SITE_OTHER): Payer: Medicare Other | Admitting: Family Medicine

## 2016-10-22 VITALS — BP 140/83 | HR 76 | Resp 16 | Ht 68.0 in | Wt 201.0 lb

## 2016-10-22 DIAGNOSIS — E559 Vitamin D deficiency, unspecified: Secondary | ICD-10-CM

## 2016-10-22 DIAGNOSIS — E890 Postprocedural hypothyroidism: Secondary | ICD-10-CM

## 2016-10-22 DIAGNOSIS — F419 Anxiety disorder, unspecified: Secondary | ICD-10-CM

## 2016-10-22 DIAGNOSIS — M5126 Other intervertebral disc displacement, lumbar region: Secondary | ICD-10-CM | POA: Diagnosis not present

## 2016-10-22 DIAGNOSIS — E669 Obesity, unspecified: Secondary | ICD-10-CM | POA: Diagnosis not present

## 2016-10-22 NOTE — Progress Notes (Signed)
Date:  10/22/2016   Name:  Ralph Salinas   DOB:  [birth year]   MRN:  [id number]  PCP:  Adline Potter, MD    Chief Complaint: Back Pain (Herniated disc. Has seen [facility] and also saw Chiropractor that helped for a little bit. He is not sleeping well from chronic pain all night. He does do heat at night on back. Seen by chiropractor 2 weeks ago [employer] here in [place]. Neck is better now but back is hurting. Has been over a year since seeing spine specialist at [facility].  )   History of Present Illness:  This is a 71 y.o. male seen for three month f/u. C/o persistent low back pain, saw Dr. Ivor Reining ortho last year, MRI showed herniated disc, shots recommended but never received. No LE radiation or weakness noted. Seeing chiro regularly, helping neck but not back, having to take [facility] powder 3-4 times/d. Off Zocor and Celebrex, saw psych yesterday, Abilify dose increased. Taking vit D supp and Synthroid as before, chiro also has on ca/mg/D supplement. Concerned re: weight gain.  Review of Systems:  Review of Systems  Constitutional: Negative for chills and fever.  Respiratory: Negative for cough and shortness of breath.   Cardiovascular: Negative for chest pain and leg swelling.  Genitourinary: Negative for difficulty urinating.  Neurological: Negative for syncope and light-headedness.    Patient Active Problem List   Diagnosis Date Noted  . Herniated lumbar intervertebral disc 10/22/2016  . Vitamin D deficiency 02/11/2016  . Insomnia 02/11/2016  . Osteopenia 01/10/2016  . Gonalgia 11/21/2015  . Benzodiazepine dependence ([place]) 10/10/2015  . Benign prostatic hyperplasia with urinary obstruction 03/04/2015  . Elevated prostate specific antigen (PSA) 03/04/2015  . Degeneration of intervertebral disc of cervical region 08/16/2014  . Anxiety 01/17/2014  . Hyperlipidemia 01/17/2014  . Hypothyroidism, postop 01/17/2014  . Chronic tension-type headache,  intractable 01/16/2014  . Panic attack 01/16/2014    Prior to Admission medications   Medication Sig Start Date End Date Taking? Authorizing Provider  ALPRAZolam Duanne Moron) 1 MG tablet Take 1 mg by mouth 2 (two) times daily as needed.    Yes Historical Provider, MD  ARIPiprazole (ABILIFY) 30 MG tablet Take 5 mg by mouth daily.   Yes Historical Provider, MD  Aspirin-Salicylamide-Caffeine (BC HEADACHE PO) Take 3 Packages by mouth daily.   Yes Historical Provider, MD  CALCIUM-MAGNESIUM-VITAMIN D PO Take 1 tablet by mouth daily.   Yes Historical Provider, MD  Cholecalciferol (VITAMIN D3) 1000 units CAPS Take 1 capsule (1,000 Units total) by mouth daily. 01/11/16  Yes Adline Potter, MD  DULoxetine (CYMBALTA) 60 MG capsule Take 1 capsule by mouth daily. 09/27/15  Yes Historical Provider, MD  levothyroxine (SYNTHROID, LEVOTHROID) 150 MCG tablet Take 1 tablet (150 mcg total) by mouth daily. 02/11/16  Yes Glean Hess, MD    No Known Allergies  Past Surgical History:  Procedure Laterality Date  . MENISCUS REPAIR Right 2008  . TOTAL THYROIDECTOMY  1996   hyperthyroidism    Social History  Substance Use Topics  . Smoking status: Never Smoker  . Smokeless tobacco: Never Used  . Alcohol use No    Family History  Problem Relation Age of Onset  . Heart attack Mother   . Leukemia Father     Medication list has been reviewed and updated.  Physical Examination: BP 140/83   Pulse 76   Resp 16   Ht 5\' 8"  (1.727 m)   Wt 201  lb (91.2 kg)   SpO2 97%   BMI 30.56 kg/m   Physical Exam  Constitutional: He appears well-developed and well-nourished.  Cardiovascular: Normal rate, regular rhythm and normal heart sounds.   Pulmonary/Chest: Effort normal and breath sounds normal.  Musculoskeletal: He exhibits no edema.  Mildly tender midlumbar spine  Neurological: He is alert.  Skin: Skin is warm and dry.  Psychiatric: He has a normal mood and affect. His behavior is normal.  Nursing note and  vitals reviewed.   Assessment and Plan:  1. Herniated lumbar intervertebral disc Persistent pain despite chiro care, refer back Dr. Sharlet Salina - Ambulatory referral to Orthopedic Surgery  2. Hypothyroidism, postop Well controlled on Synthroid  3. Anxiety Psych following on Cymbalta/Abilify/Xanax  4. Vitamin D deficiency Well controlled on supplement  5. Obesity (BMI 30.0-34.9) Likely worse due to Abilify/decreased exercise with back pain  Return if symptoms worsen or fail to improve.  Satira Anis. [facility]  10/22/2016

## 2016-11-02 DIAGNOSIS — M5126 Other intervertebral disc displacement, lumbar region: Secondary | ICD-10-CM | POA: Diagnosis not present

## 2016-11-02 DIAGNOSIS — M5416 Radiculopathy, lumbar region: Secondary | ICD-10-CM | POA: Diagnosis not present

## 2016-11-02 DIAGNOSIS — M62830 Muscle spasm of back: Secondary | ICD-10-CM | POA: Diagnosis not present

## 2016-11-12 DIAGNOSIS — M1711 Unilateral primary osteoarthritis, right knee: Secondary | ICD-10-CM | POA: Diagnosis not present

## 2016-11-19 DIAGNOSIS — M170 Bilateral primary osteoarthritis of knee: Secondary | ICD-10-CM | POA: Diagnosis not present

## 2016-11-26 DIAGNOSIS — E7800 Pure hypercholesterolemia, unspecified: Secondary | ICD-10-CM | POA: Diagnosis not present

## 2016-11-26 DIAGNOSIS — I10 Essential (primary) hypertension: Secondary | ICD-10-CM | POA: Diagnosis not present

## 2016-11-26 DIAGNOSIS — R011 Cardiac murmur, unspecified: Secondary | ICD-10-CM | POA: Diagnosis not present

## 2016-11-26 DIAGNOSIS — R0602 Shortness of breath: Secondary | ICD-10-CM | POA: Diagnosis not present

## 2016-11-27 DIAGNOSIS — L118 Other specified acantholytic disorders: Secondary | ICD-10-CM | POA: Diagnosis not present

## 2016-11-27 DIAGNOSIS — D485 Neoplasm of uncertain behavior of skin: Secondary | ICD-10-CM | POA: Diagnosis not present

## 2016-12-02 DIAGNOSIS — M5416 Radiculopathy, lumbar region: Secondary | ICD-10-CM | POA: Diagnosis not present

## 2016-12-02 DIAGNOSIS — M5126 Other intervertebral disc displacement, lumbar region: Secondary | ICD-10-CM | POA: Diagnosis not present

## 2016-12-07 ENCOUNTER — Telehealth: Payer: Self-pay | Admitting: Family Medicine

## 2016-12-07 ENCOUNTER — Ambulatory Visit (INDEPENDENT_AMBULATORY_CARE_PROVIDER_SITE_OTHER): Payer: Medicare Other | Admitting: Vascular Surgery

## 2016-12-07 ENCOUNTER — Encounter (INDEPENDENT_AMBULATORY_CARE_PROVIDER_SITE_OTHER): Payer: Self-pay | Admitting: Vascular Surgery

## 2016-12-07 VITALS — BP 148/82 | HR 76 | Resp 16 | Ht 68.0 in | Wt 199.0 lb

## 2016-12-07 DIAGNOSIS — M150 Primary generalized (osteo)arthritis: Secondary | ICD-10-CM

## 2016-12-07 DIAGNOSIS — E785 Hyperlipidemia, unspecified: Secondary | ICD-10-CM

## 2016-12-07 DIAGNOSIS — I890 Lymphedema, not elsewhere classified: Secondary | ICD-10-CM | POA: Diagnosis not present

## 2016-12-07 DIAGNOSIS — M159 Polyosteoarthritis, unspecified: Secondary | ICD-10-CM | POA: Insufficient documentation

## 2016-12-07 DIAGNOSIS — I872 Venous insufficiency (chronic) (peripheral): Secondary | ICD-10-CM | POA: Diagnosis not present

## 2016-12-07 NOTE — Progress Notes (Signed)
MRN : [id number]  Ralph Salinas is a 71 y.o. (07/22/1945) male who presents with chief complaint of No chief complaint on file. Marland Kitchen  History of Present Illness: Patient is seen for evaluation of leg swelling. The patient first noticed the swelling remotely but is now concerned because of a significant increase in the overall edema. The swelling is associated with pain and discoloration. The patient notes that in the morning the legs are significantly improved but they steadily worsened throughout the course of the day. Elevation makes the legs better, dependency makes them much worse.   There is no history of ulcerations associated with the swelling.   The patient denies any recent changes in their medications.  The patient has not been wearing graduated compression.  The patient has no had any past angiography, interventions or vascular surgery.  The patient denies a history of DVT or PE. There is no prior history of phlebitis. There is no history of primary lymphedema.  There is no history of radiation treatment to the groin or pelvis No history of malignancies. No history of trauma or groin or pelvic surgery. No history of foreign travel or parasitic infections area    No outpatient prescriptions have been marked as taking for the 12/07/16 encounter (Appointment) with Delana Meyer, Dolores Lory, MD.    Past Medical History:  Diagnosis Date  . Anxiety   . Arthritis    International aid/development worker  . Depression   . Osteopenia   . Social anxiety disorder     Past Surgical History:  Procedure Laterality Date  . MENISCUS REPAIR Right 2008  . TOTAL THYROIDECTOMY  1996   hyperthyroidism    Social History Social History  Substance Use Topics  . Smoking status: Never Smoker  . Smokeless tobacco: Never Used  . Alcohol use No    Family History Family History  Problem Relation Age of Onset  . Heart attack Mother   . Leukemia Father   No family history of bleeding/clotting  disorders, porphyria or autoimmune disease   No Known Allergies   REVIEW OF SYSTEMS (Negative unless checked)  Constitutional: [] Weight loss  [] Fever  [] Chills Cardiac: [] Chest pain   [] Chest pressure   [] Palpitations   [] Shortness of breath when laying flat   [] Shortness of breath with exertion. Vascular:  [] Pain in legs with walking   [x] Pain in legs with standing  [] History of DVT   [] Phlebitis   [x] Swelling in legs   [] Varicose veins   [] Non-healing ulcers Pulmonary:   [] Uses home oxygen   [] Productive cough   [] Hemoptysis   [] Wheeze  [] COPD   [] Asthma Neurologic:  [] Dizziness   [] Seizures   [] History of stroke   [] History of TIA  [] Aphasia   [] Vissual changes   [] Weakness or numbness in arm   [] Weakness or numbness in leg Musculoskeletal:   [] Joint swelling   [] Joint pain   [] Low back pain Hematologic:  [] Easy bruising  [] Easy bleeding   [] Hypercoagulable state   [] Anemic Gastrointestinal:  [] Diarrhea   [] Vomiting  [] Gastroesophageal reflux/heartburn   [] Difficulty swallowing. Genitourinary:  [] Chronic kidney disease   [] Difficult urination  [] Frequent urination   [] Blood in urine Skin:  [] Rashes   [] Ulcers  Psychological:  [] History of anxiety   []  History of major depression.  Physical Examination  There were no vitals filed for this visit. There is no height or weight on file to calculate BMI. Gen: WD/WN, NAD Head: [place]/AT, No temporalis wasting.  Ear/Nose/Throat: Hearing grossly intact, nares  w/o erythema or drainage, poor dentition Eyes: PER, EOMI, sclera nonicteric.  Neck: Supple, no masses.  No bruit or JVD.  Pulmonary:  Good air movement, clear to auscultation bilaterally, no use of accessory muscles.  Cardiac: RRR, normal S1, S2, no Murmurs. Vascular: 2+ edema bilaterally with mild venous skin changes,  Diffuse scattered varicose veins bilatterally Vessel Right Left  Radial Palpable Palpable  Ulnar Palpable Palpable  Brachial Palpable Palpable  Carotid Palpable  Palpable  Femoral Palpable Palpable  Popliteal Palpable Palpable  PT Palpable Palpable  DP Palpable Palpable   Gastrointestinal: soft, non-distended. No guarding/no peritoneal signs.  Musculoskeletal: M/S 5/5 throughout.  No deformity or atrophy.  Neurologic: CN 2-12 intact. Pain and light touch intact in extremities.  Symmetrical.  Speech is fluent. Motor exam as listed above. Psychiatric: Judgment intact, Mood & affect appropriate for pt's clinical situation. Dermatologic: venous rashes no ulcers noted.  No changes consistent with cellulitis. Lymph : No Cervical lymphadenopathy, no lichenification or skin changes of chronic lymphedema.  CBC Lab Results  Component Value Date   WBC 3.8 02/28/2016   HCT 45.6 02/28/2016   MCV 93 02/28/2016   PLT 179 02/28/2016    BMET    Component Value Date/Time   NA 141 02/28/2016 0903   K 4.7 02/28/2016 0903   CL 98 02/28/2016 0903   CO2 26 02/28/2016 0903   GLUCOSE 90 02/28/2016 0903   BUN 24 02/28/2016 0903   CREATININE 1.00 02/28/2016 0903   CALCIUM 8.9 02/28/2016 0903   GFRNONAA 77 02/28/2016 0903   GFRAA 89 02/28/2016 0903   CrCl cannot be calculated (Patient's most recent lab result is older than the maximum 21 days allowed.).  COAG No results found for: INR, PROTIME  Radiology No results found.  Assessment/Plan 1. Chronic venous insufficiency I have had a long discussion with the patient regarding swelling and why it  causes symptoms.  Patient will begin wearing graduated compression stockings class 1 (20-30 mmHg) on a daily basis a prescription was given. The patient will  beginning wearing the stockings first thing in the morning and removing them in the evening. The patient is instructed specifically not to sleep in the stockings.   In addition, behavioral modification will be initiated.  This will include frequent elevation, use of over the counter pain medications and exercise such as walking.  I have reviewed systemic  causes for chronic edema such as liver, kidney and cardiac etiologies.  The patient denies problems with these organ systems.    Consideration for a lymph pump will also be made based upon the effectiveness of conservative therapy.  This would help to improve the edema control and prevent sequela such as ulcers and infections   Patient should undergo duplex ultrasound of the venous system to ensure that DVT or reflux is not present.  The patient will follow-up with me after the ultrasound.    2. Lymphedema See #1  3. Primary osteoarthritis involving multiple joints Continue NSAID medications as already ordered, these medications have been reviewed and there are no changes at this time.  I specifically discussed with the patient the fact that his lumbar spine disease is likely contributing significantly to his leg pain and restless leg syndrome. In particular he was noting increased symptoms at night when in bed and the physiology of venous disease was reviewed discussing how elevation and bedrest actually improves venous symptoms.  4. Hyperlipidemia, unspecified hyperlipidemia type Continue statin as ordered and reviewed, no changes at this time  Hortencia Pilar, MD  12/07/2016 8:26 AM

## 2016-12-07 NOTE — Telephone Encounter (Signed)
Called pt to schedule Annual Wellness Visit with Nurse Health Advisor for June:  - knb ° °

## 2016-12-22 DIAGNOSIS — F411 Generalized anxiety disorder: Secondary | ICD-10-CM | POA: Diagnosis not present

## 2016-12-22 DIAGNOSIS — F329 Major depressive disorder, single episode, unspecified: Secondary | ICD-10-CM | POA: Diagnosis not present

## 2016-12-22 DIAGNOSIS — F410 Panic disorder [episodic paroxysmal anxiety] without agoraphobia: Secondary | ICD-10-CM | POA: Diagnosis not present

## 2016-12-29 DIAGNOSIS — M5416 Radiculopathy, lumbar region: Secondary | ICD-10-CM | POA: Diagnosis not present

## 2016-12-29 DIAGNOSIS — M5126 Other intervertebral disc displacement, lumbar region: Secondary | ICD-10-CM | POA: Diagnosis not present

## 2016-12-29 DIAGNOSIS — M62830 Muscle spasm of back: Secondary | ICD-10-CM | POA: Diagnosis not present

## 2016-12-30 DIAGNOSIS — R011 Cardiac murmur, unspecified: Secondary | ICD-10-CM | POA: Diagnosis not present

## 2016-12-30 DIAGNOSIS — R0602 Shortness of breath: Secondary | ICD-10-CM | POA: Diagnosis not present

## 2017-01-07 DIAGNOSIS — E7800 Pure hypercholesterolemia, unspecified: Secondary | ICD-10-CM | POA: Diagnosis not present

## 2017-01-07 DIAGNOSIS — R079 Chest pain, unspecified: Secondary | ICD-10-CM | POA: Insufficient documentation

## 2017-01-08 ENCOUNTER — Ambulatory Visit (INDEPENDENT_AMBULATORY_CARE_PROVIDER_SITE_OTHER): Payer: Medicare Other | Admitting: Family Medicine

## 2017-01-08 ENCOUNTER — Encounter: Payer: Self-pay | Admitting: Family Medicine

## 2017-01-08 ENCOUNTER — Other Ambulatory Visit: Payer: Self-pay

## 2017-01-08 ENCOUNTER — Ambulatory Visit: Payer: Medicare Other | Admitting: Family Medicine

## 2017-01-08 VITALS — BP 140/82 | HR 72 | Resp 16 | Ht 68.0 in | Wt 201.0 lb

## 2017-01-08 DIAGNOSIS — F419 Anxiety disorder, unspecified: Secondary | ICD-10-CM | POA: Diagnosis not present

## 2017-01-08 DIAGNOSIS — R079 Chest pain, unspecified: Secondary | ICD-10-CM

## 2017-01-08 DIAGNOSIS — E890 Postprocedural hypothyroidism: Secondary | ICD-10-CM

## 2017-01-08 DIAGNOSIS — R5383 Other fatigue: Secondary | ICD-10-CM | POA: Diagnosis not present

## 2017-01-08 DIAGNOSIS — M150 Primary generalized (osteo)arthritis: Secondary | ICD-10-CM | POA: Diagnosis not present

## 2017-01-08 DIAGNOSIS — E559 Vitamin D deficiency, unspecified: Secondary | ICD-10-CM | POA: Diagnosis not present

## 2017-01-08 DIAGNOSIS — G44221 Chronic tension-type headache, intractable: Secondary | ICD-10-CM | POA: Diagnosis not present

## 2017-01-08 DIAGNOSIS — E66811 Obesity, class 1: Secondary | ICD-10-CM

## 2017-01-08 DIAGNOSIS — E669 Obesity, unspecified: Secondary | ICD-10-CM | POA: Diagnosis not present

## 2017-01-08 DIAGNOSIS — M159 Polyosteoarthritis, unspecified: Secondary | ICD-10-CM

## 2017-01-08 MED ORDER — ACETAMINOPHEN 500 MG PO TABS: 1000.0000 mg | ORAL_TABLET | Freq: Two times a day (BID) | ORAL | 0 refills | Status: DC

## 2017-01-08 NOTE — Progress Notes (Signed)
Date:  01/08/2017   Name:  Ralph Salinas   DOB:  February 25, 1946   MRN:  [id number]  PCP:  Adline Potter, MD    Chief Complaint: Hypothyroidism (6 mo fu- discuss vaccines )   History of Present Illness:  This is a 71 y.o. male see for 6 month f/u. C/o fatigue, weight gain, feels like before removed thyroid. Psych has adjusted meds but no help. Knee and back injections are helping pain a lot but still taking BC powders at least once a day. Started vit D/ca/mg supp in addition to usual vitamin D supp per chiro recommendation. Saw cards yest, for stress test soon.  Review of Systems:  Review of Systems  Constitutional: Negative for chills and fever.  Respiratory: Negative for cough.   Cardiovascular: Negative for leg swelling.  Genitourinary: Negative for difficulty urinating.  Neurological: Negative for tremors, syncope and light-headedness.    Patient Active Problem List   Diagnosis Date Noted  . Chest pain with high risk for cardiac etiology 01/07/2017  . Chronic venous insufficiency 12/07/2016  . Lymphedema 12/07/2016  . DJD (degenerative joint disease), multiple sites 12/07/2016  . Herniated lumbar intervertebral disc 10/22/2016  . Obesity (BMI 30.0-34.9) 10/22/2016  . Vitamin D deficiency 02/11/2016  . Insomnia 02/11/2016  . Osteopenia 01/10/2016  . Gonalgia 11/21/2015  . Benzodiazepine dependence ([place]) 10/10/2015  . Benign prostatic hyperplasia with urinary obstruction 03/04/2015  . Elevated prostate specific antigen (PSA) 03/04/2015  . Degeneration of intervertebral disc of cervical region 08/16/2014  . Anxiety 01/17/2014  . Hyperlipidemia 01/17/2014  . Hypothyroidism, postop 01/17/2014  . Chronic tension-type headache, intractable 01/16/2014  . Panic attack 01/16/2014    Prior to Admission medications   Medication Sig Start Date End Date Taking? Authorizing Provider  ALPRAZolam Duanne Moron) 1 MG tablet Take 1 mg by mouth 2 (two) times daily as needed.    Yes  [provider]  ARIPiprazole (ABILIFY) 2 MG tablet  12/22/16  Yes [provider]  Aspirin-Salicylamide-Caffeine (BC HEADACHE PO) Take 3 Packages by mouth daily as needed.   Yes [provider]  CALCIUM-MAGNESIUM-VITAMIN D PO Take 1 tablet by mouth daily.   Yes [provider]  Cholecalciferol (VITAMIN D3) 1000 units CAPS Take 1 capsule (1,000 Units total) by mouth daily. 01/11/16  Yes Beatrice Ziehm, Gwyndolyn Saxon, MD  DULoxetine (CYMBALTA) 60 MG capsule Take 1 capsule by mouth daily. 09/27/15  Yes [provider]  levothyroxine (SYNTHROID, LEVOTHROID) 150 MCG tablet Take 1 tablet (150 mcg total) by mouth daily. 02/11/16  Yes Glean Hess, MD  Multiple Vitamin (MULTIVITAMIN) capsule Take by mouth.   Yes [provider]  zolpidem (AMBIEN) 10 MG tablet  12/22/16  Yes [provider]  acetaminophen (TYLENOL) 500 MG tablet Take 2 tablets (1,000 mg total) by mouth 2 (two) times daily. 01/08/17   Adline Potter, MD    No Known Allergies  Past Surgical History:  Procedure Laterality Date  . MENISCUS REPAIR Right 2008  . TOTAL THYROIDECTOMY  1996   hyperthyroidism    Social History  Substance Use Topics  . Smoking status: Never Smoker  . Smokeless tobacco: Never Used  . Alcohol use No    Family History  Problem Relation Age of Onset  . Heart attack Mother   . Leukemia Father     Medication list has been reviewed and updated.  Physical Examination: BP 140/82   Pulse 72   Resp 16   Ht 5\' 8"  (1.727 m)   Wt 201  lb (91.2 kg)   SpO2 96%   BMI 30.56 kg/m   Physical Exam  Constitutional: He appears well-developed and well-nourished.  Cardiovascular: Normal rate, regular rhythm and normal heart sounds.   Pulmonary/Chest: Effort normal and breath sounds normal.  Musculoskeletal: He exhibits no edema.  Neurological: He is alert.  Skin: Skin is warm and dry.  Psychiatric: He has a normal mood and affect. His behavior is normal.  Nursing  note and vitals reviewed.   Assessment and Plan:  1. Hypothyroidism, postop Dose increased last year - TSH  2. Other fatigue Discussed medical vs. psychiatric etiologies - Comprehensive Metabolic Panel (CMET) - CBC  3. Chronic tension-type headache, intractable Advised tapering off daily BC powders, begin Tylenol 1000 mg bid to see if helps  4. Primary osteoarthritis involving multiple joints Tylenol bid, ortho following  5. Anxiety On multiple meds, psych following  6. Obesity (BMI 30.0-34.9) Weight stable, weight loss discussed, Abilify may be contributing  7. Chest pain with high risk for cardiac etiology Cards following, for stress test soon - Lipid Profile  8. Vitamin D deficiency On increased supplement - Vitamin D (25 hydroxy)  Return in about 6 months (around 07/10/2017).  Satira Anis. [facility]  01/08/2017

## 2017-01-09 LAB — COMPREHENSIVE METABOLIC PANEL
A/G RATIO: 1.9 (ref 1.2–2.2)
ALT: 26 IU/L (ref 0–44)
AST: 26 IU/L (ref 0–40)
Albumin: 4.4 g/dL (ref 3.6–4.8)
Alkaline Phosphatase: 81 IU/L (ref 39–117)
BUN/Creatinine Ratio: 27 — ABNORMAL HIGH (ref 10–24)
BUN: 26 mg/dL (ref 8–27)
Bilirubin Total: 0.3 mg/dL (ref 0.0–1.2)
CALCIUM: 9.1 mg/dL (ref 8.6–10.2)
CO2: 25 mmol/L (ref 20–29)
CREATININE: 0.97 mg/dL (ref 0.76–1.27)
Chloride: 100 mmol/L (ref 96–106)
GFR calc Af Amer: 92 mL/min/{1.73_m2} (ref >59)
GFR, EST NON AFRICAN AMERICAN: 80 mL/min/{1.73_m2} (ref >59)
GLOBULIN, TOTAL: 2.3 g/dL (ref 1.5–4.5)
Glucose: 82 mg/dL (ref 65–99)
POTASSIUM: 4.6 mmol/L (ref 3.5–5.2)
SODIUM: 142 mmol/L (ref 134–144)
Total Protein: 6.7 g/dL (ref 6.0–8.5)

## 2017-01-09 LAB — LIPID PANEL
CHOL/HDL RATIO: 4.6 ratio (ref 0.0–5.0)
CHOLESTEROL TOTAL: 299 mg/dL — AB (ref 100–199)
HDL: 65 mg/dL (ref >39)
LDL CALC: 200 mg/dL — AB (ref 0–99)
Triglycerides: 168 mg/dL — ABNORMAL HIGH (ref 0–149)
VLDL Cholesterol Cal: 34 mg/dL (ref 5–40)

## 2017-01-09 LAB — CBC
Hematocrit: 46.1 % (ref 37.5–51.0)
Hemoglobin: 16.1 g/dL (ref 13.0–17.7)
MCH: 32.4 pg (ref 26.6–33.0)
MCHC: 34.9 g/dL (ref 31.5–35.7)
MCV: 93 fL (ref 79–97)
PLATELETS: 180 10*3/uL (ref 150–379)
RBC: 4.97 x10E6/uL (ref 4.14–5.80)
RDW: 14.8 % (ref 12.3–15.4)
WBC: 4.1 10*3/uL (ref 3.4–10.8)

## 2017-01-09 LAB — TSH: TSH: 12.66 u[IU]/mL — AB (ref 0.450–4.500)

## 2017-01-09 LAB — VITAMIN D 25 HYDROXY (VIT D DEFICIENCY, FRACTURES): Vit D, 25-Hydroxy: 21.3 ng/mL — ABNORMAL LOW (ref 30.0–100.0)

## 2017-01-11 ENCOUNTER — Other Ambulatory Visit: Payer: Self-pay | Admitting: Family Medicine

## 2017-01-11 DIAGNOSIS — E890 Postprocedural hypothyroidism: Secondary | ICD-10-CM

## 2017-01-11 MED ORDER — LEVOTHYROXINE SODIUM 175 MCG PO TABS: 175.0000 ug | ORAL_TABLET | Freq: Every day | ORAL | 2 refills | Status: AC

## 2017-01-11 MED ORDER — VITAMIN D3 50 MCG (2000 UT) PO CAPS: 2000.0000 [IU] | ORAL_CAPSULE | Freq: Every day | ORAL | Status: DC

## 2017-03-23 DIAGNOSIS — F329 Major depressive disorder, single episode, unspecified: Secondary | ICD-10-CM | POA: Diagnosis not present

## 2017-03-23 DIAGNOSIS — F410 Panic disorder [episodic paroxysmal anxiety] without agoraphobia: Secondary | ICD-10-CM | POA: Diagnosis not present

## 2017-03-23 DIAGNOSIS — F411 Generalized anxiety disorder: Secondary | ICD-10-CM | POA: Diagnosis not present

## 2017-03-25 ENCOUNTER — Ambulatory Visit (INDEPENDENT_AMBULATORY_CARE_PROVIDER_SITE_OTHER): Payer: Medicare Other | Admitting: Vascular Surgery

## 2017-03-25 ENCOUNTER — Ambulatory Visit (INDEPENDENT_AMBULATORY_CARE_PROVIDER_SITE_OTHER): Payer: Medicare Other

## 2017-03-25 DIAGNOSIS — I872 Venous insufficiency (chronic) (peripheral): Secondary | ICD-10-CM | POA: Diagnosis not present

## 2017-03-25 DIAGNOSIS — I890 Lymphedema, not elsewhere classified: Secondary | ICD-10-CM

## 2017-04-06 ENCOUNTER — Telehealth (INDEPENDENT_AMBULATORY_CARE_PROVIDER_SITE_OTHER): Payer: Self-pay | Admitting: Vascular Surgery

## 2017-04-06 NOTE — Telephone Encounter (Signed)
Patient called seeking his results from US done 9/6. States his situation isn't getting better. Anxious to know what is going on.

## 2017-04-07 ENCOUNTER — Telehealth (INDEPENDENT_AMBULATORY_CARE_PROVIDER_SITE_OTHER): Payer: Self-pay | Admitting: Vascular Surgery

## 2017-04-07 DIAGNOSIS — M5126 Other intervertebral disc displacement, lumbar region: Secondary | ICD-10-CM | POA: Diagnosis not present

## 2017-04-07 DIAGNOSIS — M5416 Radiculopathy, lumbar region: Secondary | ICD-10-CM | POA: Diagnosis not present

## 2017-04-07 NOTE — Telephone Encounter (Signed)
Left message requesting return phone call to discuss venous reflux results

## 2017-04-07 NOTE — Telephone Encounter (Signed)
Patient will get a call back as to what to do regarding the results of his u/s if something needs to be done, once Ralph Salinas has a chance to review the u/s.

## 2017-04-08 DIAGNOSIS — E785 Hyperlipidemia, unspecified: Secondary | ICD-10-CM | POA: Diagnosis not present

## 2017-04-08 DIAGNOSIS — Z7689 Persons encountering health services in other specified circumstances: Secondary | ICD-10-CM | POA: Diagnosis not present

## 2017-04-08 DIAGNOSIS — E669 Obesity, unspecified: Secondary | ICD-10-CM | POA: Diagnosis not present

## 2017-04-08 DIAGNOSIS — E890 Postprocedural hypothyroidism: Secondary | ICD-10-CM | POA: Diagnosis not present

## 2017-04-08 DIAGNOSIS — Z23 Encounter for immunization: Secondary | ICD-10-CM | POA: Diagnosis not present

## 2017-04-08 DIAGNOSIS — Z1159 Encounter for screening for other viral diseases: Secondary | ICD-10-CM | POA: Diagnosis not present

## 2017-04-08 DIAGNOSIS — F410 Panic disorder [episodic paroxysmal anxiety] without agoraphobia: Secondary | ICD-10-CM | POA: Diagnosis not present

## 2017-04-08 DIAGNOSIS — Z0000 Encounter for general adult medical examination without abnormal findings: Secondary | ICD-10-CM | POA: Diagnosis not present

## 2017-04-08 DIAGNOSIS — I10 Essential (primary) hypertension: Secondary | ICD-10-CM | POA: Diagnosis not present

## 2017-04-08 DIAGNOSIS — Z79899 Other long term (current) drug therapy: Secondary | ICD-10-CM | POA: Diagnosis not present

## 2017-04-09 DIAGNOSIS — I10 Essential (primary) hypertension: Secondary | ICD-10-CM | POA: Diagnosis not present

## 2017-04-13 ENCOUNTER — Telehealth (INDEPENDENT_AMBULATORY_CARE_PROVIDER_SITE_OTHER): Payer: Self-pay | Admitting: Vascular Surgery

## 2017-04-13 NOTE — Telephone Encounter (Signed)
Called patient to discuss venous duplex results. Since his last visit, the patient has been wearing medical grade one compression stockings, elevating his legs and remaining active with minimal improvement in symptoms. States his bilateral lower extremity continues to "swell" and cause discomfort. This discomfort is described as an "aching". Patient was found to have reflux within the deep system on the left lower extremity and reflux to both the superficial/deep system to the right lower extremity. Patient would greatly benefit from the added therapy of a lymphedema pump. We discussed how to use this. Patient would like to proceed. Patient to follow up in the office in 3 months to assess his progress.

## 2017-05-18 DIAGNOSIS — Z23 Encounter for immunization: Secondary | ICD-10-CM | POA: Diagnosis not present

## 2017-05-24 DIAGNOSIS — Z0000 Encounter for general adult medical examination without abnormal findings: Secondary | ICD-10-CM | POA: Diagnosis not present

## 2017-05-25 DIAGNOSIS — F411 Generalized anxiety disorder: Secondary | ICD-10-CM | POA: Diagnosis not present

## 2017-05-25 DIAGNOSIS — F410 Panic disorder [episodic paroxysmal anxiety] without agoraphobia: Secondary | ICD-10-CM | POA: Diagnosis not present

## 2017-05-25 DIAGNOSIS — F329 Major depressive disorder, single episode, unspecified: Secondary | ICD-10-CM | POA: Diagnosis not present

## 2017-06-09 DIAGNOSIS — M1711 Unilateral primary osteoarthritis, right knee: Secondary | ICD-10-CM | POA: Diagnosis not present

## 2017-06-09 DIAGNOSIS — M1712 Unilateral primary osteoarthritis, left knee: Secondary | ICD-10-CM | POA: Diagnosis not present

## 2017-07-15 ENCOUNTER — Ambulatory Visit: Payer: Medicare Other | Admitting: Family Medicine

## 2017-07-15 DIAGNOSIS — F329 Major depressive disorder, single episode, unspecified: Secondary | ICD-10-CM | POA: Diagnosis not present

## 2017-07-15 DIAGNOSIS — F410 Panic disorder [episodic paroxysmal anxiety] without agoraphobia: Secondary | ICD-10-CM | POA: Diagnosis not present

## 2017-07-15 DIAGNOSIS — F411 Generalized anxiety disorder: Secondary | ICD-10-CM | POA: Diagnosis not present

## 2017-07-22 ENCOUNTER — Ambulatory Visit (INDEPENDENT_AMBULATORY_CARE_PROVIDER_SITE_OTHER): Payer: Medicare Other | Admitting: Vascular Surgery

## 2017-09-15 DIAGNOSIS — F411 Generalized anxiety disorder: Secondary | ICD-10-CM | POA: Diagnosis not present

## 2017-09-15 DIAGNOSIS — F329 Major depressive disorder, single episode, unspecified: Secondary | ICD-10-CM | POA: Diagnosis not present

## 2017-09-15 DIAGNOSIS — F410 Panic disorder [episodic paroxysmal anxiety] without agoraphobia: Secondary | ICD-10-CM | POA: Diagnosis not present

## 2017-10-04 DIAGNOSIS — M62830 Muscle spasm of back: Secondary | ICD-10-CM | POA: Diagnosis not present

## 2017-10-04 DIAGNOSIS — M5126 Other intervertebral disc displacement, lumbar region: Secondary | ICD-10-CM | POA: Diagnosis not present

## 2017-10-04 DIAGNOSIS — M5416 Radiculopathy, lumbar region: Secondary | ICD-10-CM | POA: Diagnosis not present

## 2017-10-04 DIAGNOSIS — M25511 Pain in right shoulder: Secondary | ICD-10-CM | POA: Diagnosis not present

## 2017-10-04 DIAGNOSIS — G8929 Other chronic pain: Secondary | ICD-10-CM | POA: Diagnosis not present

## 2017-10-06 DIAGNOSIS — I10 Essential (primary) hypertension: Secondary | ICD-10-CM | POA: Diagnosis not present

## 2017-10-06 DIAGNOSIS — R972 Elevated prostate specific antigen [PSA]: Secondary | ICD-10-CM | POA: Diagnosis not present

## 2017-10-06 DIAGNOSIS — K5900 Constipation, unspecified: Secondary | ICD-10-CM | POA: Diagnosis not present

## 2017-10-06 DIAGNOSIS — Z125 Encounter for screening for malignant neoplasm of prostate: Secondary | ICD-10-CM | POA: Diagnosis not present

## 2017-10-06 DIAGNOSIS — E890 Postprocedural hypothyroidism: Secondary | ICD-10-CM | POA: Diagnosis not present

## 2017-10-06 DIAGNOSIS — E669 Obesity, unspecified: Secondary | ICD-10-CM | POA: Diagnosis not present

## 2017-10-06 DIAGNOSIS — E785 Hyperlipidemia, unspecified: Secondary | ICD-10-CM | POA: Diagnosis not present

## 2017-10-06 DIAGNOSIS — Z79899 Other long term (current) drug therapy: Secondary | ICD-10-CM | POA: Diagnosis not present

## 2017-10-06 DIAGNOSIS — F419 Anxiety disorder, unspecified: Secondary | ICD-10-CM | POA: Diagnosis not present

## 2017-10-19 DIAGNOSIS — M7541 Impingement syndrome of right shoulder: Secondary | ICD-10-CM | POA: Diagnosis not present

## 2017-10-19 DIAGNOSIS — M7542 Impingement syndrome of left shoulder: Secondary | ICD-10-CM | POA: Insufficient documentation

## 2017-10-19 DIAGNOSIS — M25511 Pain in right shoulder: Secondary | ICD-10-CM | POA: Diagnosis not present

## 2017-10-19 DIAGNOSIS — M25512 Pain in left shoulder: Secondary | ICD-10-CM | POA: Diagnosis not present

## 2017-10-21 ENCOUNTER — Encounter: Payer: Self-pay | Admitting: Urology

## 2017-10-21 ENCOUNTER — Ambulatory Visit (INDEPENDENT_AMBULATORY_CARE_PROVIDER_SITE_OTHER): Payer: Medicare Other | Admitting: Urology

## 2017-10-21 VITALS — BP 155/80 | HR 75 | Resp 16 | Ht 68.0 in | Wt 184.4 lb

## 2017-10-21 DIAGNOSIS — R3911 Hesitancy of micturition: Secondary | ICD-10-CM

## 2017-10-21 DIAGNOSIS — R972 Elevated prostate specific antigen [PSA]: Secondary | ICD-10-CM

## 2017-10-21 DIAGNOSIS — N401 Enlarged prostate with lower urinary tract symptoms: Secondary | ICD-10-CM

## 2017-10-21 MED ORDER — TAMSULOSIN HCL 0.4 MG PO CAPS: 0.4000 mg | ORAL_CAPSULE | Freq: Every day | ORAL | 0 refills | Status: DC

## 2017-10-21 NOTE — Progress Notes (Signed)
10/21/2017 1:38 PM   Delfino Lovett Emeline Gins 17-May-1946 [id number]  Referring provider: Alanson Aly, [address] [place], [place] [address]  Chief complaint: Elevated PSA levels   HPI: Ralph Salinas is a 72 year old male who in late March 2019 had a PSA drawn which was elevated at 8.98.  He was seen at [facility] in September 2016 for a PSA of 8.98.  He was treated with a course of tamsulosin and a repeat PSA was back to baseline at 2.7.  He has noted over the last several months some increased urinary hesitancy and decreased force and caliber of his urinary stream.  He denies dysuria or gross hematuria.  He denies flank, abdominal, pelvic or scrotal pain.   PMH: Past Medical History:  Diagnosis Date  . Anxiety   . Arthritis    International aid/development worker  . Depression   . Osteopenia   . Social anxiety disorder     Surgical History: Past Surgical History:  Procedure Laterality Date  . MENISCUS REPAIR Right 2008  . TOTAL THYROIDECTOMY  1996   hyperthyroidism    Home Medications:  Allergies as of 10/21/2017      Reactions   Prozac [fluoxetine Hcl] Other (See Comments)   GI Issues      Medication List        Accurate as of 10/21/17  1:38 PM. Always use your most recent med list.          ALPRAZolam 1 MG tablet Commonly known as:  XANAX Take 1 mg by mouth 2 (two) times daily as needed.   ARIPiprazole 2 MG tablet Commonly known as:  ABILIFY   BC HEADACHE PO Take 3 Packages by mouth daily as needed.   CALCIUM-MAGNESIUM-VITAMIN D PO Take 1 tablet by mouth daily.   DULoxetine 60 MG capsule Commonly known as:  CYMBALTA Take 1 capsule by mouth daily.   levothyroxine 175 MCG tablet Commonly known as:  SYNTHROID Take 1 tablet (175 mcg total) by mouth daily before breakfast.   multivitamin capsule Take by mouth.   simvastatin 20 MG tablet Commonly known as:  ZOCOR   traZODone 50 MG tablet Commonly known as:  DESYREL Take 50 mg by  mouth.   venlafaxine XR 150 MG 24 hr capsule Commonly known as:  EFFEXOR-XR Take by mouth.   Vitamin D3 2000 units capsule Take 1 capsule (2,000 Units total) by mouth daily.   zolpidem 10 MG tablet Commonly known as:  AMBIEN       Allergies:  Allergies  Allergen Reactions  . Prozac [Fluoxetine Hcl] Other (See Comments)    GI Issues    Family History: Family History  Problem Relation Age of Onset  . Heart attack Mother   . Leukemia Father     Social History:  reports that he has never smoked. He has never used smokeless tobacco. He reports that he does not drink alcohol or use drugs.  ROS: UROLOGY Frequent Urination?: No Hard to postpone urination?: No Burning/pain with urination?: No Get up at night to urinate?: Yes Leakage of urine?: No Urine stream starts and stops?: Yes Trouble starting stream?: Yes Do you have to strain to urinate?: No Blood in urine?: No Urinary tract infection?: No Sexually transmitted disease?: No Injury to kidneys or bladder?: No Painful intercourse?: No Weak stream?: No Erection problems?: No Penile pain?: No  Gastrointestinal Nausea?: No Vomiting?: No Indigestion/heartburn?: No Diarrhea?: No Constipation?: Yes  Constitutional Fever: No Night sweats?: No Weight loss?: No  Fatigue?: Yes  Skin Skin rash/lesions?: No Itching?: No  Eyes Blurred vision?: No Double vision?: No  Ears/Nose/Throat Sore throat?: No Sinus problems?: Yes  Hematologic/Lymphatic Swollen glands?: No Easy bruising?: No  Cardiovascular Leg swelling?: Yes Chest pain?: No  Respiratory Cough?: No Shortness of breath?: No  Endocrine Excessive thirst?: No  Musculoskeletal Back pain?: Yes Joint pain?: Yes  Neurological Headaches?: No Dizziness?: No  Psychologic Depression?: Yes Anxiety?: Yes  Physical Exam: BP (!) 155/80   Pulse 75   Resp 16   Ht 5\' 8"  (1.727 m)   Wt 184 lb 6.4 oz (83.6 kg)   SpO2 98%   BMI 28.04 kg/m     Constitutional:  Alert and oriented, No acute distress. HEENT: [place] AT, moist mucus membranes.  Trachea midline, no masses. Cardiovascular: No clubbing, cyanosis, or edema. Respiratory: Normal respiratory effort, no increased work of breathing. GI: Abdomen is soft, nontender, nondistended, no abdominal masses GU: No CVA tenderness.  Prostate 40 g, smooth without nodules Lymph: No cervical or inguinal lymphadenopathy. Skin: No rashes, bruises or suspicious lesions. Neurologic: Grossly intact, no focal deficits, moving all 4 extremities. Psychiatric: Normal mood and affect.  Laboratory Data: Lab Results  Component Value Date   WBC 4.1 01/08/2017   HGB 16.1 01/08/2017   HCT 46.1 01/08/2017   MCV 93 01/08/2017   PLT 180 01/08/2017    Lab Results  Component Value Date   CREATININE 0.97 01/08/2017    Assessment & Plan:   We discussed potential causes of an elevated PSA.  His elevated PSA 3 years ago was most likely secondary to inflammation.  I sent an Rx for tamsulosin and will recheck his PSA in approximately 1 month.  He will be notified with results and further recommendations.   Abbie Sons, [place] [address], [place], [place] [address] [phone]

## 2017-10-22 LAB — PSA: Prostate Specific Ag, Serum: 5.4 ng/mL — ABNORMAL HIGH (ref 0.0–4.0)

## 2017-10-25 ENCOUNTER — Telehealth: Payer: Self-pay

## 2017-10-25 NOTE — Telephone Encounter (Signed)
-----   Message from Abbie Sons, MD sent at 10/22/2017  8:38 AM EDT ----- PSA was lower at 5.4.  He is scheduled for a repeat PSA in 1 month after taking tamsulosin.

## 2017-10-25 NOTE — Telephone Encounter (Signed)
Spoke with pt in reference to PSA results and labs again after 47mo of tamsulosin. Pt voiced understanding.

## 2017-11-11 DIAGNOSIS — F331 Major depressive disorder, recurrent, moderate: Secondary | ICD-10-CM | POA: Diagnosis not present

## 2017-11-11 DIAGNOSIS — F410 Panic disorder [episodic paroxysmal anxiety] without agoraphobia: Secondary | ICD-10-CM | POA: Diagnosis not present

## 2017-11-11 DIAGNOSIS — F411 Generalized anxiety disorder: Secondary | ICD-10-CM | POA: Diagnosis not present

## 2017-11-24 ENCOUNTER — Ambulatory Visit (INDEPENDENT_AMBULATORY_CARE_PROVIDER_SITE_OTHER): Payer: Medicare Other | Admitting: Urology

## 2017-11-24 ENCOUNTER — Encounter: Payer: Self-pay | Admitting: Urology

## 2017-11-24 VITALS — BP 119/68 | HR 71 | Resp 16 | Ht 68.0 in | Wt 179.6 lb

## 2017-11-24 DIAGNOSIS — R972 Elevated prostate specific antigen [PSA]: Secondary | ICD-10-CM | POA: Diagnosis not present

## 2017-11-24 NOTE — Progress Notes (Signed)
11/24/2017 8:43 AM   Ralph Salinas 07/16/1946 [id number]  Referring provider: Alanson Aly, [address] [place], [place] [address]  Chief Complaint  Patient presents with  . Follow-up    HPI: 72 year old male presents for follow-up of an elevated PSA.  His PSA had increased to 8.98.  He has a previous history of an elevated PSA which returned to normal.  A repeat PSA the day of his visit had decreased to 5.4.  He completed a 30-day course of tamsulosin.  He has no voiding complaints.  He has had some intermittent left lower quadrant abdominal pain and wanted to be checked for a hernia.   PMH: Past Medical History:  Diagnosis Date  . Anxiety   . Arthritis    International aid/development worker  . Depression   . Osteopenia   . Social anxiety disorder     Surgical History: Past Surgical History:  Procedure Laterality Date  . MENISCUS REPAIR Right 2008  . TOTAL THYROIDECTOMY  1996   hyperthyroidism    Home Medications:  Allergies as of 11/24/2017      Reactions   Prozac [fluoxetine Hcl] Other (See Comments)   GI Issues      Medication List        Accurate as of 11/24/17  8:43 AM. Always use your most recent med list.          ALPRAZolam 1 MG tablet Commonly known as:  XANAX Take 1 mg by mouth 2 (two) times daily as needed.   ARIPiprazole 2 MG tablet Commonly known as:  ABILIFY   BC HEADACHE PO Take 3 Packages by mouth daily as needed.   CALCIUM-MAGNESIUM-VITAMIN D PO Take 1 tablet by mouth daily.   DULoxetine 60 MG capsule Commonly known as:  CYMBALTA Take 1 capsule by mouth daily.   levothyroxine 175 MCG tablet Commonly known as:  SYNTHROID Take 1 tablet (175 mcg total) by mouth daily before breakfast.   multivitamin capsule Take by mouth.   simvastatin 20 MG tablet Commonly known as:  ZOCOR   traZODone 50 MG tablet Commonly known as:  DESYREL Take 50 mg by mouth.   venlafaxine XR 150 MG 24 hr capsule Commonly  known as:  EFFEXOR-XR Take by mouth.   Vitamin D3 2000 units capsule Take 1 capsule (2,000 Units total) by mouth daily.   zolpidem 10 MG tablet Commonly known as:  AMBIEN       Allergies:  Allergies  Allergen Reactions  . Prozac [Fluoxetine Hcl] Other (See Comments)    GI Issues    Family History: Family History  Problem Relation Age of Onset  . Heart attack Mother   . Leukemia Father     Social History:  reports that he has never smoked. He has never used smokeless tobacco. He reports that he does not drink alcohol or use drugs.  ROS: UROLOGY Frequent Urination?: No Hard to postpone urination?: No Burning/pain with urination?: No Get up at night to urinate?: Yes Leakage of urine?: No Urine stream starts and stops?: Yes Trouble starting stream?: Yes Do you have to strain to urinate?: No Blood in urine?: No Urinary tract infection?: No Sexually transmitted disease?: No Injury to kidneys or bladder?: No Painful intercourse?: No Weak stream?: Yes Erection problems?: No Penile pain?: No  Gastrointestinal Nausea?: No Vomiting?: No Indigestion/heartburn?: No Diarrhea?: No Constipation?: Yes  Constitutional Fever: No Night sweats?: No Weight loss?: No Fatigue?: Yes  Skin Skin rash/lesions?: No Itching?: No  Eyes Blurred vision?: No Double vision?: No  Ears/Nose/Throat Sore throat?: No Sinus problems?: Yes  Hematologic/Lymphatic Swollen glands?: No Easy bruising?: No  Cardiovascular Leg swelling?: Yes Chest pain?: No  Respiratory Cough?: No Shortness of breath?: No  Endocrine Excessive thirst?: No  Musculoskeletal Back pain?: Yes Joint pain?: Yes  Neurological Headaches?: Yes Dizziness?: No  Psychologic Depression?: Yes Anxiety?: Yes  Physical Exam: BP 119/68   Pulse 71   Resp 16   Ht 5\' 8"  (1.727 m)   Wt 179 lb 9.6 oz (81.5 kg)   SpO2 98%   BMI 27.31 kg/m    Constitutional:  Alert and oriented, No acute  distress. HEENT: [place] AT, moist mucus membranes.  Trachea midline, no masses. Cardiovascular: No clubbing, cyanosis, or edema. Respiratory: Normal respiratory effort, no increased work of breathing. GI: Abdomen is soft, nontender, nondistended, no abdominal masses.  No hernia appreciated GU: No CVA tenderness   Laboratory Data: Lab Results  Component Value Date   WBC 4.1 01/08/2017   HGB 16.1 01/08/2017   HCT 46.1 01/08/2017   MCV 93 01/08/2017   PLT 180 01/08/2017    Lab Results  Component Value Date   CREATININE 0.97 01/08/2017     Assessment & Plan:    1. Elevated prostate specific antigen (PSA) Repeat PSA was drawn today.  If back to baseline we will continue monitoring.  If it remains elevated we discussed options of continued surveillance, prostate biopsy or additional blood testing , i.e 4K. He will think over these options.  He will be notified with his repeat PSA results.    Abbie Sons, [place] [address], [place], [place] [address] [phone]

## 2017-11-25 LAB — PSA: Prostate Specific Ag, Serum: 4 ng/mL (ref 0.0–4.0)

## 2017-11-26 ENCOUNTER — Telehealth: Payer: Self-pay

## 2017-11-26 NOTE — Telephone Encounter (Signed)
-----   Message from Abbie Sons, MD sent at 11/25/2017  7:23 PM EDT ----- Repeat PSA normal at 4.0.  Recommend a follow-up lab visit for PSA and 6 months.

## 2017-11-26 NOTE — Telephone Encounter (Signed)
Pt informed, please schedule 6 month psa

## 2017-11-27 ENCOUNTER — Encounter: Payer: Self-pay | Admitting: Emergency Medicine

## 2017-11-27 ENCOUNTER — Emergency Department
Admission: EM | Admit: 2017-11-27 | Discharge: 2017-11-27 | Disposition: A | Discharge status: Home / Self Care | Payer: Medicare Other | Attending: Emergency Medicine | Admitting: Emergency Medicine

## 2017-11-27 ENCOUNTER — Emergency Department: Payer: Medicare Other

## 2017-11-27 DIAGNOSIS — K5732 Diverticulitis of large intestine without perforation or abscess without bleeding: Secondary | ICD-10-CM | POA: Diagnosis not present

## 2017-11-27 DIAGNOSIS — R140 Abdominal distension (gaseous): Secondary | ICD-10-CM | POA: Insufficient documentation

## 2017-11-27 DIAGNOSIS — K5900 Constipation, unspecified: Secondary | ICD-10-CM | POA: Diagnosis not present

## 2017-11-27 DIAGNOSIS — R109 Unspecified abdominal pain: Secondary | ICD-10-CM | POA: Diagnosis present

## 2017-11-27 DIAGNOSIS — R1032 Left lower quadrant pain: Secondary | ICD-10-CM | POA: Diagnosis not present

## 2017-11-27 DIAGNOSIS — Z79899 Other long term (current) drug therapy: Secondary | ICD-10-CM | POA: Diagnosis not present

## 2017-11-27 DIAGNOSIS — E890 Postprocedural hypothyroidism: Secondary | ICD-10-CM | POA: Diagnosis not present

## 2017-11-27 DIAGNOSIS — K5792 Diverticulitis of intestine, part unspecified, without perforation or abscess without bleeding: Secondary | ICD-10-CM

## 2017-11-27 DIAGNOSIS — N200 Calculus of kidney: Secondary | ICD-10-CM | POA: Diagnosis not present

## 2017-11-27 LAB — CBC
HEMATOCRIT: 47.8 % (ref 40.0–52.0)
Hemoglobin: 16.5 g/dL (ref 13.0–18.0)
MCH: 31.6 pg (ref 26.0–34.0)
MCHC: 34.4 g/dL (ref 32.0–36.0)
MCV: 91.9 fL (ref 80.0–100.0)
Platelets: 201 10*3/uL (ref 150–440)
RBC: 5.2 MIL/uL (ref 4.40–5.90)
RDW: 14 % (ref 11.5–14.5)
WBC: 5.4 10*3/uL (ref 3.8–10.6)

## 2017-11-27 LAB — COMPREHENSIVE METABOLIC PANEL
ALT: 21 U/L (ref 17–63)
AST: 24 U/L (ref 15–41)
Albumin: 3.7 g/dL (ref 3.5–5.0)
Alkaline Phosphatase: 133 U/L — ABNORMAL HIGH (ref 38–126)
Anion gap: 7 (ref 5–15)
BILIRUBIN TOTAL: 0.7 mg/dL (ref 0.3–1.2)
BUN: 15 mg/dL (ref 6–20)
CO2: 25 mmol/L (ref 22–32)
Calcium: 8.6 mg/dL — ABNORMAL LOW (ref 8.9–10.3)
Chloride: 103 mmol/L (ref 101–111)
Creatinine, Ser: 1.14 mg/dL (ref 0.61–1.24)
GFR calc Af Amer: >60 mL/min (ref >60)
Glucose, Bld: 104 mg/dL — ABNORMAL HIGH (ref 65–99)
Potassium: 4 mmol/L (ref 3.5–5.1)
Sodium: 135 mmol/L (ref 135–145)
TOTAL PROTEIN: 7 g/dL (ref 6.5–8.1)

## 2017-11-27 LAB — LIPASE, BLOOD: LIPASE: 24 U/L (ref 11–51)

## 2017-11-27 MED ORDER — AMOXICILLIN-POT CLAVULANATE 875-125 MG PO TABS: 1.0000 | ORAL_TABLET | Freq: Two times a day (BID) | ORAL | 0 refills | Status: AC

## 2017-11-27 MED ORDER — IOPAMIDOL (ISOVUE-370) INJECTION 76%
75.0000 mL | Freq: Once | INTRAVENOUS | Status: AC | PRN
  Administered 2017-11-27: 75 mL via INTRAVENOUS

## 2017-11-27 MED ORDER — IOPAMIDOL (ISOVUE-300) INJECTION 61%
30.0000 mL | Freq: Once | INTRAVENOUS | Status: AC | PRN
  Administered 2017-11-27: 30 mL via ORAL

## 2017-11-27 MED ORDER — POLYETHYLENE GLYCOL 3350 17 GM/SCOOP PO POWD: 17.0000 g | Freq: Every day | ORAL | 0 refills | Status: DC

## 2017-11-27 NOTE — ED Notes (Signed)
Pt reports having BM x2, one of which was large amount.

## 2017-11-27 NOTE — ED Triage Notes (Signed)
Pt reports constipated. Pt reports intermittent issues with this since March. Pt reports no BM since Monday until he took laxative last pm and he has one this am. Pt reports his stomach feels tight like he is about to bust. Pt reports has appt with a GI MD next week.

## 2017-11-27 NOTE — ED Notes (Signed)
ED Provider at bedside. 

## 2017-11-27 NOTE — ED Notes (Signed)
Pt finished contrast, CT notified

## 2017-11-27 NOTE — Discharge Instructions (Addendum)
Please take your antibiotics as prescribed for their entire course.  Please take MiraLAX 1 capful daily with a large glass of water for the next 7 days.  Please follow-up with your GI doctor this week as scheduled.  Return to the emergency department for any worsening of abdominal pain or development of fever.

## 2017-11-27 NOTE — ED Provider Notes (Signed)
[facility] Emergency Department Provider Note  Time seen: 9:59 AM  I have reviewed the triage vital signs and the nursing notes.   HISTORY  Chief Complaint Constipation    HPI Ralph Salinas is a 72 y.o. male with a past medical history of anxiety, presents to the emergency department for abdominal discomfort and constipation.  According to the patient for the past 1 month he has been experiencing intermittent constipation.  Has been taking Colace and MiraLAX without relief although states he stopped this approximately 1 to 2 weeks ago.  Patient states he has an appointment next week with GI medicine for further evaluation.  States yesterday and this morning he was in significant discomfort which he describes as a abdominal distention worse than the left lower quadrant.  Took a laxative last night was able to have a small bowel movement this morning with some relief.  Denies any discomfort currently.  States he feels very distended however and believes he is still significantly constipated.  Denies any fever.  Denies any vomiting although states he is nauseated.  States subjective fever but denies any measured temperature.   Past Medical History:  Diagnosis Date  . Anxiety   . Arthritis    International aid/development worker  . Depression   . Osteopenia   . Social anxiety disorder     Patient Active Problem List   Diagnosis Date Noted  . Impingement syndrome, shoulder, right 10/19/2017  . Impingement syndrome of left shoulder 10/19/2017  . Chest pain with high risk for cardiac etiology 01/07/2017  . Chronic venous insufficiency 12/07/2016  . Lymphedema 12/07/2016  . DJD (degenerative joint disease), multiple sites 12/07/2016  . Herniated lumbar intervertebral disc 10/22/2016  . Obesity (BMI 30.0-34.9) 10/22/2016  . Vitamin D deficiency 02/11/2016  . Insomnia 02/11/2016  . Osteopenia 01/10/2016  . Gonalgia 11/21/2015  . Benzodiazepine dependence ([place])  10/10/2015  . Benign prostatic hyperplasia with urinary obstruction 03/04/2015  . Elevated prostate specific antigen (PSA) 03/04/2015  . Degeneration of intervertebral disc of cervical region 08/16/2014  . Anxiety 01/17/2014  . Hyperlipidemia 01/17/2014  . Hypothyroidism, postop 01/17/2014  . Chronic tension-type headache, intractable 01/16/2014  . Panic attack 01/16/2014    Past Surgical History:  Procedure Laterality Date  . MENISCUS REPAIR Right 2008  . TOTAL THYROIDECTOMY  1996   hyperthyroidism    Prior to Admission medications   Medication Sig Start Date End Date Taking? Authorizing Provider  ALPRAZolam Duanne Moron) 1 MG tablet Take 1 mg by mouth 2 (two) times daily as needed.     [provider]  ARIPiprazole (ABILIFY) 2 MG tablet  12/22/16   [provider]  Aspirin-Salicylamide-Caffeine (BC HEADACHE PO) Take 3 Packages by mouth daily as needed.    [provider]  CALCIUM-MAGNESIUM-VITAMIN D PO Take 1 tablet by mouth daily.    [provider]  Cholecalciferol (VITAMIN D3) 2000 units capsule Take 1 capsule (2,000 Units total) by mouth daily. 01/11/17   Plonk, Gwyndolyn Saxon, MD  DULoxetine (CYMBALTA) 60 MG capsule Take 1 capsule by mouth daily. 09/27/15   [provider]  levothyroxine (SYNTHROID) 175 MCG tablet Take 1 tablet (175 mcg total) by mouth daily before breakfast. 01/11/17   Plonk, Gwyndolyn Saxon, MD  Multiple Vitamin (MULTIVITAMIN) capsule Take by mouth.    [provider]  simvastatin (ZOCOR) 20 MG tablet  10/06/17   [provider]  traZODone (DESYREL) 50 MG tablet Take 50 mg by mouth.    [provider]  venlafaxine XR (EFFEXOR-XR) 150 MG 24 hr capsule Take by mouth. 06/29/14   [provider]  zolpidem (AMBIEN) 10 MG tablet  12/22/16   [provider]    Allergies  Allergen Reactions  . Prozac [Fluoxetine Hcl] Other (See Comments)    GI Issues    Family History  Problem Relation Age of Onset   . Heart attack Mother   . Leukemia Father     Social History Social History   Tobacco Use  . Smoking status: Never Smoker  . Smokeless tobacco: Never Used  Substance Use Topics  . Alcohol use: No    Alcohol/week: 0.0 oz  . Drug use: No    Review of Systems Constitutional: Subjective fever/chills. Eyes: Negative for visual complaints  ENT: Negative for recent illness/congestion Cardiovascular: Negative for chest pain. Respiratory: Negative for shortness of breath. Gastrointestinal: Positive for abdominal distention, left lower quadrant pain although now resolved.  Positive for nausea.  Negative for vomiting. Genitourinary: Negative for urinary compaints Musculoskeletal: Negative for musculoskeletal complaints Skin: Negative for skin complaints  Neurological: Negative for headache All other ROS negative  ____________________________________________   PHYSICAL EXAM:  VITAL SIGNS: ED Triage Vitals  Enc Vitals Group     BP 11/27/17 0922 (!) 159/80     Pulse Rate 11/27/17 0922 81     Resp --      Temp 11/27/17 0922 98.9 F (37.2 C)     Temp Source 11/27/17 0922 Oral     SpO2 11/27/17 0922 96 %     Weight 11/27/17 0923 180 lb (81.6 kg)     Height 11/27/17 0923 5\' 8"  (1.727 m)     Head Circumference --      Peak Flow --      Pain Score 11/27/17 0923 10     Pain Loc --      Pain Edu? --      Excl. in [place]? --     Constitutional: Alert and oriented. Well appearing and in no distress. Eyes: Normal exam ENT   Head: Normocephalic and atraumatic.   Mouth/Throat: Mucous membranes are moist. Cardiovascular: Normal rate, regular rhythm. No murmur Respiratory: Normal respiratory effort without tachypnea nor retractions. Breath sounds are clear and equal bilaterally. No wheezes/rales/rhonchi. Gastrointestinal: Soft, mild left lower quadrant tenderness to palpation.  No rebound or guarding.  No distention.  Abdomen otherwise benign. Musculoskeletal: Nontender with  normal range of motion in all extremities.  Neurologic:  Normal speech and language. No gross focal neurologic deficits  Skin:  Skin is warm, dry and intact.  Psychiatric: Mood and affect are normal.   ____________________________________________   RADIOLOGY  CT consistent with diverticulitis, uncomplicated.  ____________________________________________   INITIAL IMPRESSION / ASSESSMENT AND PLAN / ED COURSE  Pertinent labs & imaging results that were available during my care of the patient were reviewed by me and considered in my medical decision making (see chart for details).  Patient presents to the emergency department for 1 month of intermittent abdominal pain distention and constipation.  States significant distention and constipation last night and today although somewhat improved now after taking a laxative last night was able to have a small bowel movement this morning.  Differential would include constipation, colitis/diverticulitis, partial bowel obstruction.  We will check labs obtain CT imaging of the abdomen/pelvis to further evaluate.  Patient agreeable to this plan of care.  CT scan most consistent with diverticulitis.  We will place the patient on Augmentin as well as MiraLAX for  constipation.  Patient will follow-up with GI medicine this week.  ____________________________________________   FINAL CLINICAL IMPRESSION(S) / ED DIAGNOSES  Abdominal pain and distention Constipation Diverticulitis   Harvest Dark, MD 11/27/17 1243

## 2017-11-27 NOTE — ED Notes (Signed)
Pt reports constipation x1 month. States not able to have BM regularly. Reports small BM this am and describes as "watery". Reports taking laxative yesterday.

## 2017-11-30 ENCOUNTER — Telehealth: Payer: Self-pay | Admitting: Urology

## 2017-11-30 NOTE — Telephone Encounter (Signed)
apps made and mailed to the patient  Ralph Salinas

## 2017-12-02 DIAGNOSIS — R1032 Left lower quadrant pain: Secondary | ICD-10-CM | POA: Diagnosis not present

## 2017-12-02 DIAGNOSIS — Z87898 Personal history of other specified conditions: Secondary | ICD-10-CM | POA: Diagnosis not present

## 2017-12-02 DIAGNOSIS — K5900 Constipation, unspecified: Secondary | ICD-10-CM | POA: Diagnosis not present

## 2017-12-02 DIAGNOSIS — K5732 Diverticulitis of large intestine without perforation or abscess without bleeding: Secondary | ICD-10-CM | POA: Diagnosis not present

## 2017-12-23 DIAGNOSIS — F419 Anxiety disorder, unspecified: Secondary | ICD-10-CM | POA: Diagnosis not present

## 2017-12-23 DIAGNOSIS — F5104 Psychophysiologic insomnia: Secondary | ICD-10-CM | POA: Diagnosis not present

## 2017-12-23 DIAGNOSIS — K5732 Diverticulitis of large intestine without perforation or abscess without bleeding: Secondary | ICD-10-CM | POA: Diagnosis not present

## 2018-01-06 DIAGNOSIS — F410 Panic disorder [episodic paroxysmal anxiety] without agoraphobia: Secondary | ICD-10-CM | POA: Diagnosis not present

## 2018-01-06 DIAGNOSIS — F331 Major depressive disorder, recurrent, moderate: Secondary | ICD-10-CM | POA: Diagnosis not present

## 2018-01-06 DIAGNOSIS — F411 Generalized anxiety disorder: Secondary | ICD-10-CM | POA: Diagnosis not present

## 2018-01-18 ENCOUNTER — Ambulatory Visit
Admission: RE | Admit: 2018-01-18 | Discharge: 2018-01-18 | Disposition: A | Discharge status: Home / Self Care | Payer: Medicare Other | Source: Ambulatory Visit | Attending: Internal Medicine | Admitting: Internal Medicine

## 2018-01-18 ENCOUNTER — Ambulatory Visit: Payer: Medicare Other | Admitting: Registered Nurse

## 2018-01-18 ENCOUNTER — Other Ambulatory Visit: Payer: Self-pay

## 2018-01-18 ENCOUNTER — Encounter
Admission: RE | Disposition: A | Discharge status: Home / Self Care | Payer: Self-pay | Source: Ambulatory Visit | Attending: Internal Medicine

## 2018-01-18 ENCOUNTER — Encounter: Payer: Self-pay | Admitting: *Deleted

## 2018-01-18 DIAGNOSIS — K5790 Diverticulosis of intestine, part unspecified, without perforation or abscess without bleeding: Secondary | ICD-10-CM | POA: Diagnosis not present

## 2018-01-18 DIAGNOSIS — Z79899 Other long term (current) drug therapy: Secondary | ICD-10-CM | POA: Insufficient documentation

## 2018-01-18 DIAGNOSIS — F419 Anxiety disorder, unspecified: Secondary | ICD-10-CM | POA: Diagnosis not present

## 2018-01-18 DIAGNOSIS — K5792 Diverticulitis of intestine, part unspecified, without perforation or abscess without bleeding: Secondary | ICD-10-CM | POA: Diagnosis present

## 2018-01-18 DIAGNOSIS — K640 First degree hemorrhoids: Secondary | ICD-10-CM | POA: Diagnosis not present

## 2018-01-18 DIAGNOSIS — F329 Major depressive disorder, single episode, unspecified: Secondary | ICD-10-CM | POA: Insufficient documentation

## 2018-01-18 DIAGNOSIS — K5730 Diverticulosis of large intestine without perforation or abscess without bleeding: Secondary | ICD-10-CM | POA: Diagnosis not present

## 2018-01-18 DIAGNOSIS — E785 Hyperlipidemia, unspecified: Secondary | ICD-10-CM | POA: Diagnosis not present

## 2018-01-18 DIAGNOSIS — Z8719 Personal history of other diseases of the digestive system: Secondary | ICD-10-CM | POA: Diagnosis not present

## 2018-01-18 DIAGNOSIS — F418 Other specified anxiety disorders: Secondary | ICD-10-CM | POA: Diagnosis not present

## 2018-01-18 DIAGNOSIS — K648 Other hemorrhoids: Secondary | ICD-10-CM | POA: Diagnosis not present

## 2018-01-18 DIAGNOSIS — E039 Hypothyroidism, unspecified: Secondary | ICD-10-CM | POA: Insufficient documentation

## 2018-01-18 DIAGNOSIS — R933 Abnormal findings on diagnostic imaging of other parts of digestive tract: Secondary | ICD-10-CM | POA: Diagnosis not present

## 2018-01-18 DIAGNOSIS — I739 Peripheral vascular disease, unspecified: Secondary | ICD-10-CM | POA: Diagnosis not present

## 2018-01-18 HISTORY — DX: Headache, unspecified: R51.9

## 2018-01-18 HISTORY — DX: Other intervertebral disc displacement, lumbar region: M51.26

## 2018-01-18 HISTORY — PX: COLONOSCOPY WITH PROPOFOL: SHX5780

## 2018-01-18 HISTORY — DX: Hypothyroidism, unspecified: E03.9

## 2018-01-18 HISTORY — DX: Hyperlipidemia, unspecified: E78.5

## 2018-01-18 HISTORY — DX: Other intervertebral disc degeneration, lumbar region without mention of lumbar back pain or lower extremity pain: M51.369

## 2018-01-18 HISTORY — DX: Headache: R51

## 2018-01-18 HISTORY — DX: Other intervertebral disc degeneration, lumbar region: M51.36

## 2018-01-18 SURGERY — COLONOSCOPY WITH PROPOFOL
Anesthesia: General

## 2018-01-18 MED ORDER — PROPOFOL 10 MG/ML IV BOLUS
INTRAVENOUS | Status: DC | PRN
  Administered 2018-01-18: 60 mg via INTRAVENOUS

## 2018-01-18 MED ORDER — LIDOCAINE HCL (CARDIAC) PF 100 MG/5ML IV SOSY
PREFILLED_SYRINGE | INTRAVENOUS | Status: DC | PRN
  Administered 2018-01-18: 40 mg via INTRAVENOUS

## 2018-01-18 MED ORDER — SODIUM CHLORIDE 0.9 % IV SOLN
INTRAVENOUS | Status: DC
  Administered 2018-01-18: 08:00:00 via INTRAVENOUS

## 2018-01-18 MED ORDER — PROPOFOL 500 MG/50ML IV EMUL
INTRAVENOUS | Status: DC | PRN
  Administered 2018-01-18: 140 ug/kg/min via INTRAVENOUS

## 2018-01-18 NOTE — Interval H&P Note (Signed)
History and Physical Interval Note:  01/18/2018 8:43 AM  Ralph Salinas  has presented today for surgery, with the diagnosis of Hx diverticulitis  The various methods of treatment have been discussed with the patient and family. After consideration of risks, benefits and other options for treatment, the patient has consented to  Procedure(s): COLONOSCOPY WITH PROPOFOL (N/A) as a surgical intervention .  The patient's history has been reviewed, patient examined, no change in status, stable for surgery.  I have reviewed the patient's chart and labs.  Questions were answered to the patient's satisfaction.     [place], [place]

## 2018-01-18 NOTE — H&P (Signed)
Outpatient short stay form Pre-procedure 01/18/2018 8:42 AM Teodoro K. Alice Reichert, M.D.  Primary Physician: Alanson Aly, NP  Reason for visit:  F/u diverticulitis  History of present illness:  72 y/o male presents for f/u diverticulitis. He is asymptomatic after antibiotic therapy. Currently denies change in bowel habits, rectal bleeding. Has mild anxiety.    Current Facility-Administered Medications:  .  0.9 %  sodium chloride infusion, , Intravenous, Continuous, [place], Benay Pike, MD, Last Rate: 20 mL/hr at 01/18/18 3536  Medications Prior to Admission  Medication Sig Dispense Refill Last Dose  . ALPRAZolam (XANAX) 1 MG tablet Take 1 mg by mouth 2 (two) times daily as needed.    Past Week at Unknown time  . ARIPiprazole (ABILIFY) 2 MG tablet    Past Week at Unknown time  . Aspirin-Salicylamide-Caffeine (BC HEADACHE PO) Take 3 Packages by mouth daily as needed.   Past Month at Unknown time  . CALCIUM-MAGNESIUM-VITAMIN D PO Take 1 tablet by mouth daily.   Past Week at Unknown time  . Cholecalciferol (VITAMIN D3) 2000 units capsule Take 1 capsule (2,000 Units total) by mouth daily.   Past Week at Unknown time  . DULoxetine (CYMBALTA) 60 MG capsule Take 1 capsule by mouth daily.  3 Past Week at Unknown time  . levothyroxine (SYNTHROID) 175 MCG tablet Take 1 tablet (175 mcg total) by mouth daily before breakfast. 30 tablet 2 Past Week at Unknown time  . Multiple Vitamin (MULTIVITAMIN) capsule Take by mouth.   Past Week at Unknown time  . polyethylene glycol powder (GLYCOLAX/MIRALAX) powder Take 17 g by mouth daily. 255 g 0 Past Month at Unknown time  . simvastatin (ZOCOR) 20 MG tablet   1 Past Week at Unknown time  . traZODone (DESYREL) 50 MG tablet Take 50 mg by mouth.   Past Week at Unknown time  . venlafaxine XR (EFFEXOR-XR) 150 MG 24 hr capsule Take by mouth.   Past Week at Unknown time  . zolpidem (AMBIEN) 10 MG tablet    Past Week at Unknown time     Allergies  Allergen Reactions   . Prozac [Fluoxetine Hcl] Other (See Comments)    GI Issues     Past Medical History:  Diagnosis Date  . Anxiety   . Arthritis    International aid/development worker  . Bulging of lumbar intervertebral disc   . Depression   . Headache   . Herniation of intervertebral disc of lumbar spine   . Hyperlipemia   . Hypothyroidism   . Osteopenia   . Social anxiety disorder     Review of systems:  Otherwise negative.    Physical Exam  Gen: Alert, oriented. Appears stated age.  HEENT: [place]/AT. PERRLA. Lungs: CTA, no wheezes. CV: RR nl S1, S2. Abd: soft, benign, no masses. BS+ Ext: No edema. Pulses 2+    Planned procedures: Proceed with colonoscopy. The patient understands the nature of the planned procedure, indications, risks, alternatives and potential complications including but not limited to bleeding, infection, perforation, damage to internal organs and possible oversedation/side effects from anesthesia. The patient agrees and gives consent to proceed.  Please refer to procedure notes for findings, recommendations and patient disposition/instructions.     Teodoro K. Alice Reichert, M.D. Gastroenterology 01/18/2018  8:42 AM

## 2018-01-18 NOTE — Anesthesia Post-op Follow-up Note (Signed)
Anesthesia QCDR form completed.        

## 2018-01-18 NOTE — Anesthesia Preprocedure Evaluation (Signed)
Anesthesia Evaluation  Patient identified by MRN, date of birth, ID band Patient awake    Reviewed: Allergy & Precautions, H&P , NPO status , Patient's Chart, lab work & pertinent test results, reviewed documented beta blocker date and time   Airway Mallampati: II   Neck ROM: full    Dental  (+) Poor Dentition, Teeth Intact   Pulmonary neg pulmonary ROS,    Pulmonary exam normal        Cardiovascular Exercise Tolerance: Poor + Peripheral Vascular Disease  negative cardio ROS Normal cardiovascular exam Rhythm:regular Rate:Normal     Neuro/Psych  Headaches, negative neurological ROS  negative psych ROS   GI/Hepatic negative GI ROS, Neg liver ROS,   Endo/Other  negative endocrine ROSHypothyroidism   Renal/GU negative Renal ROS  negative genitourinary   Musculoskeletal   Abdominal   Peds  Hematology negative hematology ROS (+)   Anesthesia Other Findings Past Medical History: No date: Anxiety No date: Arthritis     Comment:  Seeing Chiropractor No date: Bulging of lumbar intervertebral disc No date: Depression No date: Headache No date: Herniation of intervertebral disc of lumbar spine No date: Hyperlipemia No date: Hypothyroidism No date: Osteopenia No date: Social anxiety disorder Past Surgical History: 2008: MENISCUS REPAIR; Right 1996: TOTAL THYROIDECTOMY     Comment:  hyperthyroidism BMI    Body Mass Index:  25.85 kg/m     Reproductive/Obstetrics negative OB ROS                             Anesthesia Physical Anesthesia Plan  ASA: III  Anesthesia Plan: General   Post-op Pain Management:    Induction:   PONV Risk Score and Plan:   Airway Management Planned:   Additional Equipment:   Intra-op Plan:   Post-operative Plan:   Informed Consent: I have reviewed the patients History and Physical, chart, labs and discussed the procedure including the risks, benefits  and alternatives for the proposed anesthesia with the patient or authorized representative who has indicated his/her understanding and acceptance.   Dental Advisory Given  Plan Discussed with: CRNA  Anesthesia Plan Comments:         Anesthesia Quick Evaluation

## 2018-01-18 NOTE — Anesthesia Postprocedure Evaluation (Signed)
Anesthesia Post Note  Patient: Ralph Salinas  Procedure(s) Performed: COLONOSCOPY WITH PROPOFOL (N/A )  Patient location during evaluation: PACU Anesthesia Type: General Level of consciousness: awake and alert Pain management: pain level controlled Vital Signs Assessment: post-procedure vital signs reviewed and stable Respiratory status: spontaneous breathing, nonlabored ventilation, respiratory function stable and patient connected to nasal cannula oxygen Cardiovascular status: blood pressure returned to baseline and stable Postop Assessment: no apparent nausea or vomiting Anesthetic complications: no     Last Vitals:  Vitals:   01/18/18 0919 01/18/18 0920  BP: 125/60 125/60  Pulse: (!) 58 (!) 58  Resp: 17 (!) 8  Temp: (!) 35.9 C (!) 36 C  SpO2: 96% 96%    Last Pain:  Vitals:   01/18/18 0949  TempSrc:   PainSc: 0-No pain                 Molli Barrows

## 2018-01-18 NOTE — Transfer of Care (Signed)
Immediate Anesthesia Transfer of Care Note  Patient: Deloris Mittag  Procedure(s) Performed: COLONOSCOPY WITH PROPOFOL (N/A )  Patient Location: PACU  Anesthesia Type:General  Level of Consciousness: sedated  Airway & Oxygen Therapy: Patient Spontanous Breathing and Patient connected to nasal cannula oxygen  Post-op Assessment: Report given to RN and Post -op Vital signs reviewed and stable  Post vital signs: Reviewed and stable  Last Vitals:  Vitals Value Taken Time  BP 125/60 01/18/2018  9:20 AM  Temp 36 C 01/18/2018  9:20 AM  Pulse 58 01/18/2018  9:20 AM  Resp 19 01/18/2018  9:20 AM  SpO2 96 % 01/18/2018  9:20 AM  Vitals shown include unvalidated device data.  Last Pain:  Vitals:   01/18/18 0919  TempSrc: Tympanic  PainSc: 0-No pain         Complications: No apparent anesthesia complications

## 2018-01-18 NOTE — Op Note (Signed)
[facility] Gastroenterology Patient Name: Ralph Salinas Procedure Date: 01/18/2018 8:45 AM MRN: [id number] Account #: [id number] Date of Birth: 02-Mar-1946 Admit Type: Outpatient Age: 72 Room: [facility] ENDO ROOM 3 Gender: Male Note Status: Finalized Procedure:            Colonoscopy Indications:          Abnormal CT of the GI tract, Diverticulitis Providers:            Benay Pike. Alice Reichert MD, MD Referring MD:         Alanson Aly, MD Complications:        No immediate complications. Procedure:            Pre-Anesthesia Assessment:                       - The risks and benefits of the procedure and the                        sedation options and risks were discussed with the                        patient. All questions were answered and informed                        consent was obtained.                       - Patient identification and proposed procedure were                        verified prior to the procedure by the nurse. The                        procedure was verified in the procedure room.                       - ASA Grade Assessment: II - A patient with mild                        systemic disease.                       - After reviewing the risks and benefits, the patient                        was deemed in satisfactory condition to undergo the                        procedure.                       After obtaining informed consent, the colonoscope was                        passed under direct vision. Throughout the procedure,                        the patient's blood pressure, pulse, and oxygen                        saturations were monitored continuously. The  Colonoscope was introduced through the anus and                        advanced to the the cecum, identified by appendiceal                        orifice and ileocecal valve. The colonoscopy was                        performed without difficulty. The patient  tolerated the                        procedure well. The quality of the bowel preparation                        was good. The ileocecal valve, appendiceal orifice, and                        rectum were photographed. Findings:      The perianal and digital rectal examinations were normal. Pertinent       negatives include normal sphincter tone and no palpable rectal lesions.      Multiple small-mouthed diverticula were found in the sigmoid colon,       descending colon and transverse colon.      Non-bleeding internal hemorrhoids were found during retroflexion. The       hemorrhoids were Grade I (internal hemorrhoids that do not prolapse).      The exam was otherwise without abnormality. Impression:           - Diverticulosis in the sigmoid colon, in the                        descending colon and in the transverse colon.                       - Non-bleeding internal hemorrhoids.                       - The examination was otherwise normal.                       - No specimens collected. Recommendation:       - Patient has a contact number available for                        emergencies. The signs and symptoms of potential                        delayed complications were discussed with the patient.                        Return to normal activities tomorrow. Written discharge                        instructions were provided to the patient.                       - Resume previous diet.                       - Continue present medications.                       -  Repeat colonoscopy in 10 years for screening purposes.                       - Return to GI office PRN.                       - The findings and recommendations were discussed with                        the patient and their family. Procedure Code(s):    --- Professional ---                       [phone], Colonoscopy, flexible; diagnostic, including                        collection of specimen(s) by brushing or washing, when                         performed (separate procedure) Diagnosis Code(s):    --- Professional ---                       R93.3, Abnormal findings on diagnostic imaging of other                        parts of digestive tract                       K57.30, Diverticulosis of large intestine without                        perforation or abscess without bleeding                       K64.0, First degree hemorrhoids CPT copyright 2017 American [employer] Association. All rights reserved. The codes documented in this report are preliminary and upon coder review may  be revised to meet current compliance requirements. Efrain Sella MD, MD 01/18/2018 9:20:53 AM This report has been signed electronically. Number of Addenda: 0 Note Initiated On: 01/18/2018 8:45 AM Scope Withdrawal Time: 0 hours 6 minutes 18 seconds  Total Procedure Duration: 0 hours 17 minutes 23 seconds       [facility]

## 2018-04-06 DIAGNOSIS — F410 Panic disorder [episodic paroxysmal anxiety] without agoraphobia: Secondary | ICD-10-CM | POA: Diagnosis not present

## 2018-04-06 DIAGNOSIS — F331 Major depressive disorder, recurrent, moderate: Secondary | ICD-10-CM | POA: Diagnosis not present

## 2018-04-06 DIAGNOSIS — F411 Generalized anxiety disorder: Secondary | ICD-10-CM | POA: Diagnosis not present

## 2018-04-22 ENCOUNTER — Ambulatory Visit
Admission: EM | Admit: 2018-04-22 | Discharge: 2018-04-22 | Disposition: A | Discharge status: Nursing Home (Includes ICF) | Payer: Medicare Other | Attending: Family Medicine | Admitting: Family Medicine

## 2018-04-22 DIAGNOSIS — Z23 Encounter for immunization: Secondary | ICD-10-CM

## 2018-04-22 DIAGNOSIS — S56492A Other injury of extensor muscle, fascia and tendon of left index finger at forearm level, initial encounter: Secondary | ICD-10-CM | POA: Diagnosis not present

## 2018-04-22 DIAGNOSIS — E039 Hypothyroidism, unspecified: Secondary | ICD-10-CM | POA: Diagnosis not present

## 2018-04-22 DIAGNOSIS — S61241A Puncture wound with foreign body of left index finger without damage to nail, initial encounter: Secondary | ICD-10-CM | POA: Diagnosis not present

## 2018-04-22 DIAGNOSIS — S62631B Displaced fracture of distal phalanx of left index finger, initial encounter for open fracture: Secondary | ICD-10-CM

## 2018-04-22 DIAGNOSIS — S62621A Displaced fracture of medial phalanx of left index finger, initial encounter for closed fracture: Secondary | ICD-10-CM | POA: Diagnosis not present

## 2018-04-22 DIAGNOSIS — S62621B Displaced fracture of medial phalanx of left index finger, initial encounter for open fracture: Secondary | ICD-10-CM | POA: Diagnosis not present

## 2018-04-22 DIAGNOSIS — S62625B Displaced fracture of medial phalanx of left ring finger, initial encounter for open fracture: Secondary | ICD-10-CM | POA: Diagnosis not present

## 2018-04-22 DIAGNOSIS — E7800 Pure hypercholesterolemia, unspecified: Secondary | ICD-10-CM | POA: Diagnosis not present

## 2018-04-22 DIAGNOSIS — S61201A Unspecified open wound of left index finger without damage to nail, initial encounter: Secondary | ICD-10-CM | POA: Diagnosis not present

## 2018-04-22 DIAGNOSIS — W3312XA Accidental malfunction of hunting rifle, initial encounter: Secondary | ICD-10-CM

## 2018-04-22 DIAGNOSIS — S62631A Displaced fracture of distal phalanx of left index finger, initial encounter for closed fracture: Secondary | ICD-10-CM | POA: Diagnosis not present

## 2018-04-22 DIAGNOSIS — E785 Hyperlipidemia, unspecified: Secondary | ICD-10-CM | POA: Diagnosis not present

## 2018-04-22 DIAGNOSIS — F329 Major depressive disorder, single episode, unspecified: Secondary | ICD-10-CM | POA: Diagnosis not present

## 2018-04-22 MED ORDER — TETANUS-DIPHTH-ACELL PERTUSSIS 5-2.5-18.5 LF-MCG/0.5 IM SUSP
0.5000 mL | Freq: Once | INTRAMUSCULAR | Status: AC
Start: 2018-04-22 — End: 2018-04-22
  Administered 2018-04-22: 0.5 mL via INTRAMUSCULAR
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
## 2018-04-22 NOTE — Discharge Instructions (Signed)
Recommend patient go to Emergency Department for further evaluation and management °

## 2018-04-27 DIAGNOSIS — S62629A Displaced fracture of medial phalanx of unspecified finger, initial encounter for closed fracture: Secondary | ICD-10-CM | POA: Diagnosis not present

## 2018-04-27 DIAGNOSIS — S62631A Displaced fracture of distal phalanx of left index finger, initial encounter for closed fracture: Secondary | ICD-10-CM | POA: Diagnosis not present

## 2018-04-27 DIAGNOSIS — S62621B Displaced fracture of medial phalanx of left index finger, initial encounter for open fracture: Secondary | ICD-10-CM | POA: Diagnosis not present

## 2018-04-28 DIAGNOSIS — M1990 Unspecified osteoarthritis, unspecified site: Secondary | ICD-10-CM | POA: Diagnosis not present

## 2018-04-28 DIAGNOSIS — F410 Panic disorder [episodic paroxysmal anxiety] without agoraphobia: Secondary | ICD-10-CM | POA: Diagnosis not present

## 2018-04-28 DIAGNOSIS — Z1889 Other specified retained foreign body fragments: Secondary | ICD-10-CM | POA: Diagnosis not present

## 2018-04-28 DIAGNOSIS — M6281 Muscle weakness (generalized): Secondary | ICD-10-CM | POA: Diagnosis not present

## 2018-04-28 DIAGNOSIS — E890 Postprocedural hypothyroidism: Secondary | ICD-10-CM | POA: Diagnosis not present

## 2018-04-28 DIAGNOSIS — Z79899 Other long term (current) drug therapy: Secondary | ICD-10-CM | POA: Diagnosis not present

## 2018-04-28 DIAGNOSIS — S62621S Displaced fracture of medial phalanx of left index finger, sequela: Secondary | ICD-10-CM | POA: Diagnosis not present

## 2018-04-28 DIAGNOSIS — F329 Major depressive disorder, single episode, unspecified: Secondary | ICD-10-CM | POA: Diagnosis not present

## 2018-05-13 DIAGNOSIS — Z23 Encounter for immunization: Secondary | ICD-10-CM | POA: Diagnosis not present

## 2018-05-25 DIAGNOSIS — S62631D Displaced fracture of distal phalanx of left index finger, subsequent encounter for fracture with routine healing: Secondary | ICD-10-CM | POA: Diagnosis not present

## 2018-05-25 DIAGNOSIS — Z967 Presence of other bone and tendon implants: Secondary | ICD-10-CM | POA: Diagnosis not present

## 2018-05-25 DIAGNOSIS — S62621D Displaced fracture of medial phalanx of left index finger, subsequent encounter for fracture with routine healing: Secondary | ICD-10-CM | POA: Diagnosis not present

## 2018-05-30 ENCOUNTER — Other Ambulatory Visit: Payer: Medicare Other

## 2018-06-01 ENCOUNTER — Other Ambulatory Visit: Payer: Medicare Other

## 2018-06-02 ENCOUNTER — Ambulatory Visit: Payer: Medicare Other | Admitting: Urology

## 2018-06-03 ENCOUNTER — Other Ambulatory Visit: Payer: Self-pay

## 2018-06-06 ENCOUNTER — Ambulatory Visit: Payer: Medicare Other | Admitting: Urology

## 2018-06-08 DIAGNOSIS — M1990 Unspecified osteoarthritis, unspecified site: Secondary | ICD-10-CM | POA: Diagnosis not present

## 2018-06-08 DIAGNOSIS — S62621S Displaced fracture of medial phalanx of left index finger, sequela: Secondary | ICD-10-CM | POA: Diagnosis not present

## 2018-06-08 DIAGNOSIS — Z1889 Other specified retained foreign body fragments: Secondary | ICD-10-CM | POA: Diagnosis not present

## 2018-06-08 DIAGNOSIS — Z79899 Other long term (current) drug therapy: Secondary | ICD-10-CM | POA: Diagnosis not present

## 2018-06-08 DIAGNOSIS — S62621B Displaced fracture of medial phalanx of left index finger, initial encounter for open fracture: Secondary | ICD-10-CM | POA: Diagnosis not present

## 2018-06-08 DIAGNOSIS — M6281 Muscle weakness (generalized): Secondary | ICD-10-CM | POA: Diagnosis not present

## 2018-06-08 DIAGNOSIS — F410 Panic disorder [episodic paroxysmal anxiety] without agoraphobia: Secondary | ICD-10-CM | POA: Diagnosis not present

## 2018-06-08 DIAGNOSIS — E890 Postprocedural hypothyroidism: Secondary | ICD-10-CM | POA: Diagnosis not present

## 2018-06-08 DIAGNOSIS — S62633D Displaced fracture of distal phalanx of left middle finger, subsequent encounter for fracture with routine healing: Secondary | ICD-10-CM | POA: Diagnosis not present

## 2018-06-08 DIAGNOSIS — F329 Major depressive disorder, single episode, unspecified: Secondary | ICD-10-CM | POA: Diagnosis not present

## 2018-06-29 DIAGNOSIS — F329 Major depressive disorder, single episode, unspecified: Secondary | ICD-10-CM | POA: Diagnosis not present

## 2018-06-29 DIAGNOSIS — M1990 Unspecified osteoarthritis, unspecified site: Secondary | ICD-10-CM | POA: Diagnosis not present

## 2018-06-29 DIAGNOSIS — Z79899 Other long term (current) drug therapy: Secondary | ICD-10-CM | POA: Diagnosis not present

## 2018-06-29 DIAGNOSIS — Z1889 Other specified retained foreign body fragments: Secondary | ICD-10-CM | POA: Diagnosis not present

## 2018-06-29 DIAGNOSIS — S62621S Displaced fracture of medial phalanx of left index finger, sequela: Secondary | ICD-10-CM | POA: Diagnosis not present

## 2018-06-29 DIAGNOSIS — E890 Postprocedural hypothyroidism: Secondary | ICD-10-CM | POA: Diagnosis not present

## 2018-06-29 DIAGNOSIS — M6281 Muscle weakness (generalized): Secondary | ICD-10-CM | POA: Diagnosis not present

## 2018-06-29 DIAGNOSIS — F410 Panic disorder [episodic paroxysmal anxiety] without agoraphobia: Secondary | ICD-10-CM | POA: Diagnosis not present

## 2018-07-06 DIAGNOSIS — Z1889 Other specified retained foreign body fragments: Secondary | ICD-10-CM | POA: Diagnosis not present

## 2018-07-06 DIAGNOSIS — F329 Major depressive disorder, single episode, unspecified: Secondary | ICD-10-CM | POA: Diagnosis not present

## 2018-07-06 DIAGNOSIS — S62621S Displaced fracture of medial phalanx of left index finger, sequela: Secondary | ICD-10-CM | POA: Diagnosis not present

## 2018-07-06 DIAGNOSIS — F410 Panic disorder [episodic paroxysmal anxiety] without agoraphobia: Secondary | ICD-10-CM | POA: Diagnosis not present

## 2018-07-06 DIAGNOSIS — M6281 Muscle weakness (generalized): Secondary | ICD-10-CM | POA: Diagnosis not present

## 2018-07-06 DIAGNOSIS — E890 Postprocedural hypothyroidism: Secondary | ICD-10-CM | POA: Diagnosis not present

## 2019-09-26 ENCOUNTER — Other Ambulatory Visit: Payer: Self-pay | Admitting: Family Medicine

## 2019-09-26 DIAGNOSIS — R112 Nausea with vomiting, unspecified: Secondary | ICD-10-CM

## 2019-09-26 DIAGNOSIS — R1013 Epigastric pain: Secondary | ICD-10-CM

## 2019-09-29 ENCOUNTER — Ambulatory Visit
Admission: RE | Admit: 2019-09-29 | Discharge: 2019-09-29 | Disposition: A | Discharge status: Home / Self Care | Payer: Medicare Other | Source: Ambulatory Visit | Attending: Family Medicine | Admitting: Family Medicine

## 2019-09-29 ENCOUNTER — Other Ambulatory Visit: Payer: Self-pay

## 2019-09-29 DIAGNOSIS — R112 Nausea with vomiting, unspecified: Secondary | ICD-10-CM | POA: Diagnosis present

## 2019-09-29 DIAGNOSIS — R1013 Epigastric pain: Secondary | ICD-10-CM | POA: Diagnosis present

## 2021-04-21 ENCOUNTER — Other Ambulatory Visit: Payer: Self-pay | Admitting: Physical Medicine and Rehabilitation

## 2021-04-21 DIAGNOSIS — M5412 Radiculopathy, cervical region: Secondary | ICD-10-CM

## 2021-04-29 ENCOUNTER — Other Ambulatory Visit: Payer: Self-pay

## 2021-04-29 ENCOUNTER — Ambulatory Visit
Admission: RE | Admit: 2021-04-29 | Discharge: 2021-04-29 | Disposition: A | Discharge status: Home / Self Care | Payer: Medicare Other | Source: Ambulatory Visit | Attending: Physical Medicine and Rehabilitation | Admitting: Physical Medicine and Rehabilitation

## 2021-04-29 DIAGNOSIS — M5412 Radiculopathy, cervical region: Secondary | ICD-10-CM | POA: Insufficient documentation

## 2024-01-18 ENCOUNTER — Ambulatory Visit
Admission: RE | Admit: 2024-01-18 | Discharge: 2024-01-18 | Disposition: A | Discharge status: Home / Self Care | Source: Ambulatory Visit | Attending: Nurse Practitioner | Admitting: Nurse Practitioner

## 2024-01-18 ENCOUNTER — Other Ambulatory Visit: Payer: Self-pay | Admitting: Nurse Practitioner

## 2024-01-18 DIAGNOSIS — R140 Abdominal distension (gaseous): Secondary | ICD-10-CM

## 2024-01-18 DIAGNOSIS — R110 Nausea: Secondary | ICD-10-CM

## 2024-01-18 MED ORDER — IOPAMIDOL (ISOVUE-300) INJECTION 61%
100.0000 mL | Freq: Once | INTRAVENOUS | Status: AC | PRN
  Administered 2024-01-18: 100 mL via INTRAVENOUS

## 2024-05-10 ENCOUNTER — Encounter: Payer: Self-pay | Admitting: Gastroenterology
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
## 2024-05-16 ENCOUNTER — Ambulatory Visit
Admission: RE | Admit: 2024-05-16 | Discharge: 2024-05-16 | Disposition: A | Discharge status: Home / Self Care | Attending: Gastroenterology | Admitting: Gastroenterology

## 2024-05-16 ENCOUNTER — Ambulatory Visit: Payer: Self-pay | Admitting: Anesthesiology

## 2024-05-16 ENCOUNTER — Encounter
Admission: RE | Disposition: A | Discharge status: Home / Self Care | Payer: Self-pay | Source: Home / Self Care | Attending: Gastroenterology

## 2024-05-16 ENCOUNTER — Encounter: Payer: Self-pay | Admitting: Gastroenterology

## 2024-05-16 DIAGNOSIS — I10 Essential (primary) hypertension: Secondary | ICD-10-CM | POA: Diagnosis not present

## 2024-05-16 DIAGNOSIS — R933 Abnormal findings on diagnostic imaging of other parts of digestive tract: Secondary | ICD-10-CM | POA: Insufficient documentation

## 2024-05-16 DIAGNOSIS — D122 Benign neoplasm of ascending colon: Secondary | ICD-10-CM | POA: Diagnosis not present

## 2024-05-16 DIAGNOSIS — E039 Hypothyroidism, unspecified: Secondary | ICD-10-CM | POA: Insufficient documentation

## 2024-05-16 DIAGNOSIS — K644 Residual hemorrhoidal skin tags: Secondary | ICD-10-CM | POA: Diagnosis not present

## 2024-05-16 DIAGNOSIS — K5730 Diverticulosis of large intestine without perforation or abscess without bleeding: Secondary | ICD-10-CM | POA: Diagnosis not present

## 2024-05-16 DIAGNOSIS — K635 Polyp of colon: Secondary | ICD-10-CM | POA: Diagnosis not present

## 2024-05-16 DIAGNOSIS — D124 Benign neoplasm of descending colon: Secondary | ICD-10-CM | POA: Diagnosis not present

## 2024-05-16 DIAGNOSIS — D120 Benign neoplasm of cecum: Secondary | ICD-10-CM | POA: Diagnosis not present

## 2024-05-16 DIAGNOSIS — M1990 Unspecified osteoarthritis, unspecified site: Secondary | ICD-10-CM | POA: Diagnosis not present

## 2024-05-16 HISTORY — DX: Postprocedural hypothyroidism: E89.0

## 2024-05-16 HISTORY — DX: Essential (primary) hypertension: I10

## 2024-05-16 HISTORY — DX: Cardiac murmur, unspecified: R01.1

## 2024-05-16 HISTORY — DX: Motion sickness, initial encounter: T75.3XXA

## 2024-05-16 HISTORY — PX: POLYPECTOMY: SHX149

## 2024-05-16 HISTORY — PX: COLONOSCOPY: SHX5424

## 2024-05-16 HISTORY — DX: Paresthesia of skin: R20.2

## 2024-05-16 SURGERY — COLONOSCOPY
Anesthesia: General | Site: Rectum

## 2024-05-16 MED ORDER — PROPOFOL 500 MG/50ML IV EMUL
INTRAVENOUS | Status: DC | PRN
  Administered 2024-05-16: 125 ug/kg/min via INTRAVENOUS

## 2024-05-16 MED ORDER — LIDOCAINE HCL (CARDIAC) PF 100 MG/5ML IV SOSY
PREFILLED_SYRINGE | INTRAVENOUS | Status: DC | PRN
Start: 2024-05-16 — End: 2024-05-16
  Administered 2024-05-16: 60 mg via INTRAVENOUS

## 2024-05-16 MED ORDER — PROPOFOL 1000 MG/100ML IV EMUL
INTRAVENOUS | Status: AC
  Filled 2024-05-16: qty 100

## 2024-05-16 MED ORDER — GLYCOPYRROLATE 0.2 MG/ML IJ SOLN
INTRAMUSCULAR | Status: AC
  Filled 2024-05-16: qty 1

## 2024-05-16 MED ORDER — PROPOFOL 10 MG/ML IV BOLUS
INTRAVENOUS | Status: DC | PRN
  Administered 2024-05-16: 70 mg via INTRAVENOUS

## 2024-05-16 MED ORDER — STERILE WATER FOR IRRIGATION IR SOLN
Status: DC | PRN
  Administered 2024-05-16: 1

## 2024-05-16 MED ORDER — LIDOCAINE HCL (PF) 2 % IJ SOLN
INTRAMUSCULAR | Status: AC
  Filled 2024-05-16: qty 5

## 2024-05-16 MED ORDER — LACTATED RINGERS IV SOLN
INTRAVENOUS | Status: DC
Start: 2024-05-16 — End: 2024-05-16
  Administered 2024-05-16: 1000 mL via INTRAVENOUS

## 2024-05-16 MED ORDER — ONDANSETRON HCL 4 MG/2ML IJ SOLN: 4.0000 mg | Freq: Once | INTRAMUSCULAR | Status: DC

## 2024-05-16 SURGICAL SUPPLY — 8 items
FORCEPS BIOP RAD 4 LRG CAP 4 (CUTTING FORCEPS) IMPLANT
GAUZE SPONGE 4X4 12PLY STRL (GAUZE/BANDAGES/DRESSINGS) IMPLANT
GOWN CVR UNV OPN BCK APRN NK (MISCELLANEOUS) ×4 IMPLANT
KIT PROCEDURE OLYMPUS (MISCELLANEOUS) ×2 IMPLANT
MANIFOLD NEPTUNE II (INSTRUMENTS) ×2 IMPLANT
SNARE COLD EXACTO (MISCELLANEOUS) IMPLANT
TRAP ETRAP POLY (MISCELLANEOUS) IMPLANT
WATER STERILE IRR 250ML POUR (IV SOLUTION) ×2 IMPLANT

## 2024-05-16 NOTE — Op Note (Signed)
 [facility] Patient Name: Ralph Salinas Procedure Date: 05/16/2024 9:07 AM MRN: [id number] Account #: [id number] Date of Birth: 10-Feb-1946 Admit Type: Outpatient Age: 78 Room: [facility] OR ROOM 01 Gender: Male Note Status: Finalized Instrument Name: Colonoscope [id number] Procedure:             Colonoscopy Indications:           Last colonoscopy: July 2019, , Lower abdominal pain,                         Abnormal CT of the GI tract, sigmoid colon thickening Providers:             Corinn Jess Brooklyn MD, MD Referring MD:          Cheryl CHARLENA Jericho (Referring MD) Medicines:             General Anesthesia Complications:         No immediate complications. Estimated blood loss: None. Procedure:             Pre-Anesthesia Assessment:                        - Prior to the procedure, a History and Physical was                         performed, and patient medications and allergies were                         reviewed. The patient is competent. The risks and                         benefits of the procedure and the sedation options and                         risks were discussed with the patient. All questions                         were answered and informed consent was obtained.                         Patient identification and proposed procedure were                         verified by the physician, the nurse, the                         anesthesiologist, the anesthetist and the technician                         in the pre-procedure area in the procedure room in the                         endoscopy suite. Mental Status Examination: alert and                         oriented. Airway Examination: normal oropharyngeal                         airway and neck mobility. Respiratory Examination:  clear to auscultation. CV Examination: normal.                         Prophylactic Antibiotics: The patient does not require                          prophylactic antibiotics. Prior Anticoagulants: The                         patient has taken no anticoagulant or antiplatelet                         agents. ASA Grade Assessment: II - A patient with mild                         systemic disease. After reviewing the risks and                         benefits, the patient was deemed in satisfactory                         condition to undergo the procedure. The anesthesia                         plan was to use general anesthesia. Immediately prior                         to administration of medications, the patient was                         re-assessed for adequacy to receive sedatives. The                         heart rate, respiratory rate, oxygen saturations,                         blood pressure, adequacy of pulmonary ventilation, and                         response to care were monitored throughout the                         procedure. The physical status of the patient was                         re-assessed after the procedure.                        After obtaining informed consent, the colonoscope was                         passed under direct vision. Throughout the procedure,                         the patient's blood pressure, pulse, and oxygen                         saturations were monitored continuously. The  Colonoscope was introduced through the anus and                         advanced to the the cecum, identified by appendiceal                         orifice and ileocecal valve. The colonoscopy was                         performed without difficulty. The patient tolerated                         the procedure well. The quality of the bowel                         preparation was evaluated using the BBPS [facility] Bowel                         Preparation Scale) with scores of: Right Colon = 3,                         Transverse Colon = 3 and Left Colon = 3 (entire mucosa                          seen well with no residual staining, small fragments                         of stool or opaque liquid). The total BBPS score                         equals 9. The ileocecal valve, appendiceal orifice,                         and rectum were photographed. Findings:      The perianal and digital rectal examinations were normal. Pertinent       negatives include normal sphincter tone and no palpable rectal lesions.      Skin tags were found on perianal exam.      Six sessile polyps were found in the descending colon, transverse colon,       ascending colon and cecum. The polyps were 3 to 7 mm in size. These       polyps were removed with a cold snare. Resection and retrieval were       complete.      Multiple medium-mouthed diverticula were found in the sigmoid colon.       Erythema was seen in association with the diverticular opening.       Peri-diverticular erythema was seen. Biopsies were taken with a cold       forceps for histology.      Non-bleeding external hemorrhoids were found during retroflexion. The       hemorrhoids were medium-sized.      The exam was otherwise without abnormality. Impression:            - Perianal skin tags found on perianal exam.                        - Six 3 to 7 mm polyps in the descending  colon, in the                         transverse colon, in the ascending colon and in the                         cecum, removed with a cold snare. Resected and                         retrieved.                        - Severe diverticulosis in the sigmoid colon. Erythema                         was seen in association with the diverticular opening.                         Peri-diverticular erythema was seen. Biopsied.                        - Non-bleeding external hemorrhoids.                        - The examination was otherwise normal. Recommendation:        - Discharge patient to home (with escort).                        - Resume previous diet  today.                        - Continue present medications.                        - Await pathology results.                        - Repeat colonoscopy in 3 years for surveillance of                         multiple polyps. Procedure Code(s):     --- Professional ---                        [phone], Colonoscopy, flexible; with removal of                         tumor(s), polyp(s), or other lesion(s) by snare                         technique                        45380, 59, Colonoscopy, flexible; with biopsy, single                         or multiple Diagnosis Code(s):     --- Professional ---                        D12.4, Benign neoplasm of descending colon  D12.3, Benign neoplasm of transverse colon (hepatic                         flexure or splenic flexure)                        D12.2, Benign neoplasm of ascending colon                        D12.0, Benign neoplasm of cecum                        K64.4, Residual hemorrhoidal skin tags                        R10.30, Lower abdominal pain, unspecified                        K57.30, Diverticulosis of large intestine without                         perforation or abscess without bleeding                        R93.3, Abnormal findings on diagnostic imaging of                         other parts of digestive tract CPT copyright 2022 American Medical Association. All rights reserved. The codes documented in this report are preliminary and upon coder review may  be revised to meet current compliance requirements. Dr. Corinn Brooklyn Corinn Jess Brooklyn MD, MD 05/16/2024 9:43:43 AM This report has been signed electronically. Number of Addenda: 0 Note Initiated On: 05/16/2024 9:07 AM Scope Withdrawal Time: 0 hours 13 minutes 25 seconds  Total Procedure Duration: 0 hours 15 minutes 9 seconds  Estimated Blood Loss:  Estimated blood loss: none.      [facility]

## 2024-05-16 NOTE — Anesthesia Postprocedure Evaluation (Signed)
 Anesthesia Post Note  Patient: Ralph Salinas  Procedure(s) Performed: COLONOSCOPY POLYPECTOMY, INTESTINE (Rectum)  Patient location during evaluation: PACU Anesthesia Type: General Level of consciousness: awake and alert Pain management: pain level controlled Vital Signs Assessment: post-procedure vital signs reviewed and stable Respiratory status: spontaneous breathing, nonlabored ventilation, respiratory function stable and patient connected to nasal cannula oxygen Cardiovascular status: blood pressure returned to baseline and stable Postop Assessment: no apparent nausea or vomiting Anesthetic complications: no   No notable events documented.   Last Vitals:  Vitals:   05/16/24 0950 05/16/24 1000  BP: 123/66 126/74  Pulse: 65 63  Resp: (!) 21 13  Temp: 36.5 C 36.5 C  SpO2: 97% 97%    Last Pain:  Vitals:   05/16/24 1000  TempSrc:   PainSc: 0-No pain                 Yousof Alderman C Yadiel Aubry

## 2024-05-16 NOTE — H&P (Signed)
 Corinn JONELLE Brooklyn, MD [facility], [address]  [place], [place] [address]  Main: [phone] Fax:  [phone] Pager: [phone]   Primary Care Physician:  Jeffie Cheryl BRAVO, MD Primary Gastroenterologist:  Dr. Corinn JONELLE Brooklyn  Pre-Procedure History & Physical: HPI:  Ralph Salinas is a 78 y.o. male is here for an colonoscopy.   Past Medical History:  Diagnosis Date   Anxiety    Arthritis    Seeing Chiropractor, knees, hips, and neck   Bulging of lumbar intervertebral disc    Car sickness    Depression    Essential hypertension    Headache    Heart murmur    Herniation of intervertebral disc of lumbar spine    Hyperlipemia    Hypothyroidism    Osteopenia    Postsurgical hypothyroidism    Social anxiety disorder     Past Surgical History:  Procedure Laterality Date   COLONOSCOPY WITH PROPOFOL  N/A 01/18/2018   Procedure: COLONOSCOPY WITH PROPOFOL ;  Surgeon: Toledo, Ladell POUR, MD;  Location: [facility] ENDOSCOPY;  Service: Endoscopy;  Laterality: N/A;   MENISCUS REPAIR Right 2008   TOTAL THYROIDECTOMY  1996   hyperthyroidism    Prior to Admission medications   Medication Sig Start Date End Date Taking? Authorizing Provider  Cyanocobalamin (B-12 COMPLIANCE INJECTION IJ) Inject as directed every 30 (thirty) days.   Yes [provider]  levothyroxine  (SYNTHROID ) 175 MCG tablet Take 1 tablet (175 mcg total) by mouth daily before breakfast. 01/11/17  Yes Plonk, Elsie, MD  losartan (COZAAR) 25 MG tablet Take 25 mg by mouth daily.   Yes [provider]  simvastatin  (ZOCOR ) 20 MG tablet  10/06/17  Yes [provider]  ALPRAZolam (XANAX) 1 MG tablet Take 1 mg by mouth 2 (two) times daily as needed.     [provider]  ARIPiprazole (ABILIFY) 2 MG tablet  12/22/16   [provider]  Aspirin-Salicylamide-Caffeine (BC HEADACHE PO) Take 3 Packages by mouth daily as needed.    [provider]   buPROPion (WELLBUTRIN XL) 300 MG 24 hr tablet  03/30/18   [provider]  CALCIUM-MAGNESIUM-VITAMIN D  PO Take 1 tablet by mouth daily. Patient not taking: Reported on 05/10/2024    [provider]  Cholecalciferol (VITAMIN D3) 2000 units capsule Take 1 capsule (2,000 Units total) by mouth daily. Patient not taking: Reported on 05/10/2024 01/11/17   Loria Elsie, MD  DULoxetine (CYMBALTA) 60 MG capsule Take 1 capsule by mouth daily. Patient not taking: Reported on 05/10/2024 09/27/15   [provider]  Multiple Vitamin (MULTIVITAMIN) capsule Take by mouth. Patient not taking: Reported on 05/10/2024    [provider]  polyethylene glycol powder (GLYCOLAX /MIRALAX ) powder Take 17 g by mouth daily. Patient not taking: Reported on 05/10/2024 11/27/17   Dorothyann Drivers, MD  traZODone (DESYREL) 50 MG tablet Take 50 mg by mouth. Patient not taking: Reported on 05/10/2024    [provider]  venlafaxine XR (EFFEXOR-XR) 150 MG 24 hr capsule Take 20 mg by mouth. 06/29/14   [provider]  zolpidem (AMBIEN) 10 MG tablet  12/22/16   [provider]    Allergies as of 05/04/2024 - Review Complete 04/22/2018  Allergen Reaction Noted   Prozac [fluoxetine hcl] Other (See Comments) 10/21/2017    Family History  Problem Relation Age of Onset   Heart attack Mother    Leukemia Father     Social History   Socioeconomic History  Marital status: Married    Spouse name: Not on file   Number of children: Not on file   Years of education: Not on file   Highest education level: Not on file  Occupational History   Not on file  Tobacco Use   Smoking status: Never   Smokeless tobacco: Never  Vaping Use   Vaping status: Never Used  Substance and Sexual Activity   Alcohol use: No    Alcohol/week: 0.0 standard drinks of alcohol   Drug use: No   Sexual activity: Not on file  Other Topics Concern   Not on file  Social History Narrative    Not on file   Social Drivers of Health   Financial Resource Strain: Medium Risk (05/28/2023)   Received from [facility]   Overall Financial Resource Strain (CARDIA)    Difficulty of Paying Living Expenses: Somewhat hard  Food Insecurity: No Food Insecurity (05/28/2023)   Received from [facility]   Hunger Vital Sign    Within the past 12 months, you worried that your food would run out before you got the money to buy more.: Never true    Within the past 12 months, the food you bought just didn't last and you didn't have money to get more.: Never true  Transportation Needs: No Transportation Needs (05/28/2023)   Received from [facility] - Transportation    In the past 12 months, has lack of transportation kept you from medical appointments or from getting medications?: No    Lack of Transportation (Non-Medical): No  Physical Activity: Not on file  Stress: Not on file  Social Connections: Not on file  Intimate Partner Violence: Not on file    Review of Systems: See HPI, otherwise negative ROS  Physical Exam: BP (!) 163/76   Pulse 70   Temp 98.4 F (36.9 C) (Temporal)   Resp 16   Ht 5' 8 (1.727 m)   Wt 81.4 kg   SpO2 98%   BMI 27.28 kg/m  General:   Alert,  pleasant and cooperative in NAD Head:  Normocephalic and atraumatic. Neck:  Supple; no masses or thyromegaly. Lungs:  Clear throughout to auscultation.    Heart:  Regular rate and rhythm. Abdomen:  Soft, nontender and nondistended. Normal bowel sounds, without guarding, and without rebound.   Neurologic:  Alert and  oriented x4;  grossly normal neurologically.  Impression/Plan: Ralph Salinas is here for an colonoscopy to be performed for Abdominal pain/bloating/abnormal ct, colon wall thickening  Risks, benefits, limitations, and alternatives regarding  colonoscopy have been reviewed with the patient.  Questions have been answered.  All  parties agreeable.   Corinn Brooklyn, MD  05/16/2024, 8:32 AM

## 2024-05-16 NOTE — Transfer of Care (Signed)
 Immediate Anesthesia Transfer of Care Note  Patient: Ralph Salinas  Procedure(s) Performed: COLONOSCOPY POLYPECTOMY, INTESTINE (Rectum)  Patient Location: PACU  Anesthesia Type: General  Level of Consciousness: awake, alert  and patient cooperative  Airway and Oxygen Therapy: Patient Spontanous Breathing   Post-op Assessment: Post-op Vital signs reviewed, Patient's Cardiovascular Status Stable, Respiratory Function Stable, Patent Airway and No signs of Nausea or vomiting  Post-op Vital Signs: Reviewed and stable  Complications: No notable events documented.

## 2024-05-18 LAB — SURGICAL PATHOLOGY

## 2024-05-19 ENCOUNTER — Ambulatory Visit: Payer: Self-pay | Admitting: Gastroenterology
